# Patient Record
Sex: Male | Born: 1950
Health system: Southern US, Community
[De-identification: ages and names within clinical notes are randomized; demographics above are authoritative.]

## PROBLEM LIST (undated history)

## (undated) DIAGNOSIS — M549 Dorsalgia, unspecified: Secondary | ICD-10-CM

## (undated) DIAGNOSIS — R531 Weakness: Secondary | ICD-10-CM

## (undated) DIAGNOSIS — H409 Unspecified glaucoma: Secondary | ICD-10-CM

## (undated) DIAGNOSIS — Z8709 Personal history of other diseases of the respiratory system: Secondary | ICD-10-CM

## (undated) DIAGNOSIS — I10 Essential (primary) hypertension: Secondary | ICD-10-CM

## (undated) DIAGNOSIS — G8929 Other chronic pain: Secondary | ICD-10-CM

## (undated) DIAGNOSIS — C801 Malignant (primary) neoplasm, unspecified: Secondary | ICD-10-CM

## (undated) DIAGNOSIS — J189 Pneumonia, unspecified organism: Secondary | ICD-10-CM

## (undated) DIAGNOSIS — M542 Cervicalgia: Secondary | ICD-10-CM

## (undated) DIAGNOSIS — J45909 Unspecified asthma, uncomplicated: Secondary | ICD-10-CM

## (undated) HISTORY — PX: ADENOIDECTOMY: SUR15

## (undated) HISTORY — PX: OTHER SURGICAL HISTORY: SHX169

## (undated) HISTORY — PX: COLONOSCOPY: SHX174

---

## 1988-11-26 HISTORY — PX: BACK SURGERY: SHX140

## 1989-11-26 HISTORY — PX: HERNIA REPAIR: SHX51

## 1998-04-21 ENCOUNTER — Ambulatory Visit (HOSPITAL_BASED_OUTPATIENT_CLINIC_OR_DEPARTMENT_OTHER): Admission: RE | Admit: 1998-04-21 | Discharge: 1998-04-21 | Payer: Self-pay | Admitting: *Deleted

## 2004-12-13 ENCOUNTER — Inpatient Hospital Stay (HOSPITAL_COMMUNITY): Admission: EM | Admit: 2004-12-13 | Discharge: 2004-12-15 | Payer: Self-pay | Admitting: *Deleted

## 2008-02-19 ENCOUNTER — Observation Stay (HOSPITAL_COMMUNITY): Admission: RE | Admit: 2008-02-19 | Discharge: 2008-02-20 | Payer: Self-pay | Admitting: Urology

## 2008-02-19 ENCOUNTER — Encounter (INDEPENDENT_AMBULATORY_CARE_PROVIDER_SITE_OTHER): Payer: Self-pay | Admitting: Urology

## 2011-04-10 NOTE — Discharge Summary (Signed)
[name], [name]             ACCOUNT NO.:  [id number]   MEDICAL RECORD NO.:  [id number]          PATIENT TYPE:  INP   LOCATION:  1443                         FACILITY:  [facility]   PHYSICIAN:  Heloise Purpura, MD      DATE OF BIRTH:  1951-11-17   DATE OF ADMISSION:  02/19/2008  DATE OF DISCHARGE:  02/20/2008                               DISCHARGE SUMMARY   ADMISSION DIAGNOSIS:  Prostate cancer.   DISCHARGE DIAGNOSIS:  Prostate cancer.   HISTORY AND PHYSICAL:  For full details, please see admission history  and physical.  Briefly, [name] is a gentleman with clinically  localized adenocarcinoma of the prostate.  He underwent a discussion  regarding management options for treatment and elected to proceed with  surgical therapy and a robotic prostatectomy.   HOSPITAL COURSE:  On February 19, 2008, the patient was taken to the  operating room and underwent a robotic-assisted laparoscopic radical  prostatectomy.  He tolerated this procedure well without complications.  Postoperatively, he was able to be transferred to a regular hospital  room following recovery from anesthesia.  He was able to begin  ambulating the night of surgery and remained hemodynamically stable that  evening.  On the morning of postoperative day #1, his hematocrit  remained stable at 33.6.  He was advanced to a clear liquid diet, which  he tolerated without difficulty, and was transitioned to oral pain  medication.  He maintained excellent urine output with minimal output  from his pelvic drain.  His pelvic drain was therefore removed.  He had  met all discharge criteria and was able to be discharged home on the  afternoon of postoperative day #1.   DISPOSITION:  Home.   DISCHARGE MEDICATIONS:  He was instructed to resume his regular home  medications excepting any aspirin, nonsteroidal anti-inflammatory drugs,  or herbal supplements.  He was given a prescription to take Vicodin as  needed for pain, Colace  as a stool softener, and to begin Cipro 1 day  prior to his return visit for Foley catheter removal.   DISCHARGE INSTRUCTIONS:  He was instructed to be ambulatory but  specifically told to refrain from any heavy lifting, strenuous activity,  or driving.  He was given instructions on routine Foley catheter care  and instructed to gradually advance his diet once passing flatus.   FOLLOW UP:  He will follow up in 1 week for removal of his Foley  catheter and to discuss his surgical pathology in detail.      Heloise Purpura, MD  Electronically Signed     LB/MEDQ  D:  02/20/2008  T:  02/21/2008  Job:  [id number]

## 2011-04-10 NOTE — Op Note (Signed)
[name], [name]             ACCOUNT NO.:  [id number]   MEDICAL RECORD NO.:  [id number]          PATIENT TYPE:  INP   LOCATION:  0004                         FACILITY:  [facility]   PHYSICIAN:  [name] Purpura, MD      DATE OF BIRTH:  Oct 12, 1951   DATE OF PROCEDURE:  02/19/2008  DATE OF DISCHARGE:                               OPERATIVE REPORT   PREOPERATIVE DIAGNOSIS:  Clinically localized adenocarcinoma of the  prostate (clinical stage T1C versus T2 NX NX).   POSTOPERATIVE DIAGNOSIS:  Clinically localized adenocarcinoma of the  prostate (clinical stage T1C versus T2 NX NX).   PROCEDURE:  Robotic assisted laparoscopic radical prostatectomy  (bilateral nerve sparing).   SURGEON:  Dr. Heloise [name].   ASSISTANT:  Dr. Bjorn [name].   ANESTHESIA:  General.   COMPLICATIONS:  None.   ESTIMATED BLOOD LOSS:  100 mL.   SPECIMEN:  Prostate and seminal vesicles.   DISPOSITION OF SPECIMENS:  To pathology.   DRAINS:  1. #19 Blake pelvic drain.  2. 20-French coude' catheter.   INDICATION:  [name] is a 60 year old gentleman with recently  diagnosed clinically localized adenocarcinoma of the prostate.  After a  discussion regarding management options for treatment, he elected to  proceed with surgical therapy and the above procedure.  The potential  risks, complications, and alternative options were discussed in detail  with the patient and informed consent was obtained.   DESCRIPTION OF PROCEDURE:  The patient was taken to the operating room  and a general anesthetic was administered.  He was given preoperative  antibiotics, placed in the dorsal lithotomy position, and prepped and  draped in the usual sterile fashion.  Next a preoperative time-out was  performed.  Next a Foley catheter was inserted into the bladder and a  site was selected just to left of the umbilicus for placement of the  camera port.  This was placed using a standard open Hassan technique.  This allowed  entry into the peritoneal cavity under direct vision and  without difficulty.  A 12-mm port was placed and a pneumoperitoneum  established.  The 0 degree lens was used to inspect the abdomen and  there was no evidence for any intra-abdominal injuries or other  abnormalities.  The remaining ports were then placed.  Bilateral 8-mm  ports were placed 10 cm lateral to and just inferior to the camera port  site.  An additional 8-mm port was placed in the far left lateral  abdominal wall.  A 5-mm port was placed between the camera port and the  right robotic port.  An additional 12-mm port was placed in the far  right lateral abdominal wall for laparoscopic assistance.  All ports  were placed under direct vision and without difficulty.  The surgical  cart was then docked.  With the aid of the cautery scissors, the bladder  was reflected posteriorly allowing entry into the space of Retzius and  identification of the endopelvic fascia and prostate.  The endopelvic  fascia was incised from the apex back to the base of the prostate  bilaterally and the underlying  levator muscle fibers were swept  laterally off the prostate.  This helped to isolate the dorsal venous  complex which was then stapled and divided with a 45 mm flex ETS  stapler.  The bladder neck was then identified with the aid of Foley  catheter manipulation, it was entered anteriorly.  The catheter was  exposed and the catheter balloon was deflated.  The catheter was brought  into the operative field and used to retract the prostate anteriorly.  This exposed the posterior bladder neck which was then divided and  dissection continued between the bladder and prostate until the vasa  deferentia and seminal vesicles were identified.  The vasa deferentia  were isolated, divided and lifted anteriorly.  The seminal vesicles were  then dissected down to their tips and care was taken to control seminal  vesicle arterial blood supply.  The  seminal vesicles were then lifted  anteriorly and the space between Denonvilliers fascia and the anterior  rectum was bluntly developed thereby isolating the vascular pedicles of  the prostate.  The lateral prostatic fascia was then incised bilaterally  allowing the neurovascular bundles to be released and the vascular  pedicles of the prostate were then ligated with Hem-o-lok clips above  the level of the neurovascular bundles.  On the right side, a standard  nerve sparing procedure was performed.  On the left side, a partial  nerve sparing was performed and some tissue was left on the prostate  based on the patient's disease.  The neurovascular bundles were then  swept off the apex of the prostate and urethra.  The urethra was then  sharply divided allowing the prostate specimen to be disarticulated.  The pelvis was then copiously irrigated and hemostasis was ensured.  There was noted to be an area that was bleeding slightly along the right  neurovascular bundle and was controlled with a Hem-o-Lok clip placed  superficially parallel to the neurovascular bundle.  Attention then  turned to the urethral anastomosis.  A 2-0 Vicryl suture was placed  between Denonvilliers fascia, the posterior bladder and posterior  urethra to reapproximate these structures.  A double-armed 3-0 Monocryl  suture was then used to perform a 360 degree running tension-free  anastomosis between the bladder neck and urethra.  A new 20-French  coude' catheter was then inserted into the bladder and irrigated.  There  were no blood clots within the bladder and the anastomosis appeared to  be watertight.  A #19 Blake drain was then brought through the left  robotic port and appropriately positioned in the pelvis. It was secured  to skin with a nylon suture.  The surgical cart was then undocked. The  right lateral port site was closed with a zero Vicryl suture placed with  the suture passer device.  All remaining ports  were removed under direct  vision and the prostate specimen was removed intact within the Endopouch  retrieval bag via the periumbilical port site.  This fascial opening was  closed with a running zero Vicryl suture.  All port sites were injected  with a quarter percent Marcaine and reapproximated to the skin with  staples.  Sterile dressings were applied.  The patient appeared to  tolerate the procedure well and without complications.  He was able to  be extubated and transferred to recovery unit in satisfactory condition.      [name] Purpura, MD  Electronically Signed     LB/MEDQ  D:  02/19/2008  T:  02/20/2008  Job:  [id number]

## 2011-04-13 NOTE — Consult Note (Signed)
[name], [name]             ACCOUNT NO.:  [id number]   MEDICAL RECORD NO.:  [id number]          PATIENT TYPE:  INP   LOCATION:  4709                         FACILITY:  [facility]   PHYSICIAN:  Lyn Records III, M.D.DATE OF BIRTH:  [birth year]   DATE OF CONSULTATION:  12/14/2004  DATE OF DISCHARGE:                                   CONSULTATION   REASON FOR CONSULTATION:  Possible cardiac event.   CONCLUSIONS:  1.  Sudden onset of left arm, hand, and leg numbness, with associated      nausea, vomiting, and dizziness.  No headache.  Rule out neurological      event.  Rule out other.  With equal pulses, doubt aortic dissection.  2.  Slight elevation in CK-MB, with normal relative index, of uncertain      significance.  Normal troponins.  3.  Systemic arterial hypertension, on therapy.  4.  History of lumbar disc disease.   RECOMMENDATIONS:  1.  Stress Cardiolite.  2.  Consider neurological evaluation.  3.  Repeat EKG and cardiac markers today.  4.  A 2D Doppler echocardiogram to rule out aortic dissection.   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  Yesterday while having his car repaired, he began feeling  sick.  He states that some dizziness started, and then relatively suddenly  he began having numbness and tingling throughout the left side of his body  but not in his face.  There was dizziness that he describes as a sensation  as though he might pass out, but no true vertigo.  There was some nausea and  eventual vomiting.  There was no specific chest discomfort.  There was no  sensation of tearing quality discomfort.  This morning, he feels better.  He  is having persisting numbness in his left arm.  He had no significant  headache or double vision or other complaint with this.   SIGNIFICANT MEDICAL PROBLEMS:  Hypertension.   MEDICATIONS:  1.  Prinivil 10 mg daily.  2.  Aspirin 1 daily.   FAMILY HISTORY:  The patient's  father had a CVA but also had renal failure.  The patient's mother has no history of vascular disease.   HABITS:  Does not smoke.  Drinks alcohol socially.   SOCIAL HISTORY:  He is married.  Has two grown sons who are in good health.  His wife is living and in good health.   EXAMINATION:  GENERAL:  The patient is in no acute distress.  BLOOD PRESSURE:  110/70.  HEART RATE:  70.  NECK:  Veins are not distended.  Carotids are 2+ and symmetric.  LUNGS:  Clear.  CARDIAC:  Normal.  No rub, click, or gallop is heard.  ABDOMEN:  Soft.  Bowel sounds are normal.  EXTREMITIES:  Reveal no edema. Femoral pulses and posterior tibial pulses  are symmetric.  Radial pulses are symmetric.   EKG is normal.  CK-MB is minimally elevated at 4.8, with upper limits being  4.  The relative index, however, is normal.  The  total CPK is elevated at  872.  This is on the second set of markers.  The troponins have been totally  negative.  Other laboratory data are completely normal.  There is a low  normal potassium.  Chest x-ray does not reveal any evidence of aortic root  enlargement, significant cardiomegaly, or any pulmonary abnormalities.   DISCUSSION:  The history is odd.  I doubt that this represents a significant  ischemic heart disease problem.  Given the patient's age and history of  hypertension, however, especially with minimal elevation in the CK-MB,  stress Cardiolite is a reasonable approach.  If the CKs remain elevated,  will consider doing a 2D Doppler echocardiogram to look at the patient's  aorta.  If numbness continues in his arm, should consider neurological  evaluation to rule out cervical disc disease or central nervous system event  accounting for the rather sudden onset of nausea, vomiting, and left-sided  numbness.       HWS/MEDQ  D:  12/14/2004  T:  12/14/2004  Job:  [id number]   cc:   Candyce Churn, M.D.  [address] [place]  [place] [address]  Fax: [phone]    Theressa Millard, M.D.  [address] [place]  [place] [address]  Fax: [phone]
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
## 2011-08-20 LAB — ABO/RH: ABO/RH(D): AB POS

## 2011-08-20 LAB — TYPE AND SCREEN
ABO/RH(D): AB POS
Antibody Screen: NEGATIVE

## 2011-08-20 LAB — BASIC METABOLIC PANEL
CO2: 26
Calcium: 9
GFR calc Af Amer: >60
Sodium: 137

## 2011-08-20 LAB — CBC
Hemoglobin: 14.5
MCHC: 33.8
RBC: 5.04
WBC: 9.4

## 2011-08-20 LAB — HEMOGLOBIN AND HEMATOCRIT, BLOOD
HCT: 37.9 — ABNORMAL LOW
Hemoglobin: 12.6 — ABNORMAL LOW

## 2013-12-07 DIAGNOSIS — N529 Male erectile dysfunction, unspecified: Secondary | ICD-10-CM | POA: Diagnosis not present

## 2013-12-07 DIAGNOSIS — I10 Essential (primary) hypertension: Secondary | ICD-10-CM | POA: Diagnosis not present

## 2013-12-07 DIAGNOSIS — Z79899 Other long term (current) drug therapy: Secondary | ICD-10-CM | POA: Diagnosis not present

## 2013-12-07 DIAGNOSIS — E782 Mixed hyperlipidemia: Secondary | ICD-10-CM | POA: Diagnosis not present

## 2013-12-07 DIAGNOSIS — Z23 Encounter for immunization: Secondary | ICD-10-CM | POA: Diagnosis not present

## 2013-12-07 DIAGNOSIS — E559 Vitamin D deficiency, unspecified: Secondary | ICD-10-CM | POA: Diagnosis not present

## 2013-12-07 DIAGNOSIS — L738 Other specified follicular disorders: Secondary | ICD-10-CM | POA: Diagnosis not present

## 2013-12-07 DIAGNOSIS — Z0000 Encounter for general adult medical examination without abnormal findings: Secondary | ICD-10-CM | POA: Diagnosis not present

## 2013-12-07 DIAGNOSIS — E669 Obesity, unspecified: Secondary | ICD-10-CM | POA: Diagnosis not present

## 2014-01-11 DIAGNOSIS — H4011X0 Primary open-angle glaucoma, stage unspecified: Secondary | ICD-10-CM | POA: Diagnosis not present

## 2014-02-02 DIAGNOSIS — E782 Mixed hyperlipidemia: Secondary | ICD-10-CM | POA: Diagnosis not present

## 2014-02-02 DIAGNOSIS — R748 Abnormal levels of other serum enzymes: Secondary | ICD-10-CM | POA: Diagnosis not present

## 2014-04-02 DIAGNOSIS — C61 Malignant neoplasm of prostate: Secondary | ICD-10-CM | POA: Diagnosis not present

## 2014-04-02 DIAGNOSIS — N529 Male erectile dysfunction, unspecified: Secondary | ICD-10-CM | POA: Diagnosis not present

## 2014-04-06 DIAGNOSIS — S139XXA Sprain of joints and ligaments of unspecified parts of neck, initial encounter: Secondary | ICD-10-CM | POA: Diagnosis not present

## 2014-04-06 DIAGNOSIS — S40019A Contusion of unspecified shoulder, initial encounter: Secondary | ICD-10-CM | POA: Diagnosis not present

## 2014-04-12 DIAGNOSIS — IMO0002 Reserved for concepts with insufficient information to code with codable children: Secondary | ICD-10-CM | POA: Diagnosis not present

## 2014-04-12 DIAGNOSIS — M5412 Radiculopathy, cervical region: Secondary | ICD-10-CM | POA: Diagnosis not present

## 2014-04-12 DIAGNOSIS — M545 Low back pain, unspecified: Secondary | ICD-10-CM | POA: Diagnosis not present

## 2014-04-12 DIAGNOSIS — M542 Cervicalgia: Secondary | ICD-10-CM | POA: Diagnosis not present

## 2014-04-15 DIAGNOSIS — H4011X0 Primary open-angle glaucoma, stage unspecified: Secondary | ICD-10-CM | POA: Diagnosis not present

## 2014-04-15 DIAGNOSIS — H2510 Age-related nuclear cataract, unspecified eye: Secondary | ICD-10-CM | POA: Diagnosis not present

## 2014-04-23 DIAGNOSIS — M545 Low back pain, unspecified: Secondary | ICD-10-CM | POA: Diagnosis not present

## 2014-04-23 DIAGNOSIS — M542 Cervicalgia: Secondary | ICD-10-CM | POA: Diagnosis not present

## 2014-04-26 DIAGNOSIS — M545 Low back pain, unspecified: Secondary | ICD-10-CM | POA: Diagnosis not present

## 2014-04-26 DIAGNOSIS — M542 Cervicalgia: Secondary | ICD-10-CM | POA: Diagnosis not present

## 2014-05-03 DIAGNOSIS — M542 Cervicalgia: Secondary | ICD-10-CM | POA: Diagnosis not present

## 2014-05-03 DIAGNOSIS — M545 Low back pain, unspecified: Secondary | ICD-10-CM | POA: Diagnosis not present

## 2014-05-05 DIAGNOSIS — M542 Cervicalgia: Secondary | ICD-10-CM | POA: Diagnosis not present

## 2014-05-05 DIAGNOSIS — M545 Low back pain, unspecified: Secondary | ICD-10-CM | POA: Diagnosis not present

## 2014-05-10 DIAGNOSIS — M542 Cervicalgia: Secondary | ICD-10-CM | POA: Diagnosis not present

## 2014-05-10 DIAGNOSIS — M545 Low back pain, unspecified: Secondary | ICD-10-CM | POA: Diagnosis not present

## 2014-05-12 DIAGNOSIS — M545 Low back pain, unspecified: Secondary | ICD-10-CM | POA: Diagnosis not present

## 2014-05-12 DIAGNOSIS — M542 Cervicalgia: Secondary | ICD-10-CM | POA: Diagnosis not present

## 2014-05-17 DIAGNOSIS — M545 Low back pain, unspecified: Secondary | ICD-10-CM | POA: Diagnosis not present

## 2014-05-17 DIAGNOSIS — M542 Cervicalgia: Secondary | ICD-10-CM | POA: Diagnosis not present

## 2014-05-19 DIAGNOSIS — M545 Low back pain, unspecified: Secondary | ICD-10-CM | POA: Diagnosis not present

## 2014-05-19 DIAGNOSIS — M542 Cervicalgia: Secondary | ICD-10-CM | POA: Diagnosis not present

## 2014-05-26 DIAGNOSIS — M47817 Spondylosis without myelopathy or radiculopathy, lumbosacral region: Secondary | ICD-10-CM | POA: Diagnosis not present

## 2014-05-26 DIAGNOSIS — M47812 Spondylosis without myelopathy or radiculopathy, cervical region: Secondary | ICD-10-CM | POA: Diagnosis not present

## 2014-06-02 DIAGNOSIS — M545 Low back pain, unspecified: Secondary | ICD-10-CM | POA: Diagnosis not present

## 2014-06-07 DIAGNOSIS — M542 Cervicalgia: Secondary | ICD-10-CM | POA: Diagnosis not present

## 2014-06-07 DIAGNOSIS — IMO0002 Reserved for concepts with insufficient information to code with codable children: Secondary | ICD-10-CM | POA: Diagnosis not present

## 2014-06-07 DIAGNOSIS — M5412 Radiculopathy, cervical region: Secondary | ICD-10-CM | POA: Diagnosis not present

## 2014-07-13 DIAGNOSIS — IMO0002 Reserved for concepts with insufficient information to code with codable children: Secondary | ICD-10-CM | POA: Diagnosis not present

## 2014-07-16 DIAGNOSIS — H4011X0 Primary open-angle glaucoma, stage unspecified: Secondary | ICD-10-CM | POA: Diagnosis not present

## 2014-08-24 DIAGNOSIS — IMO0002 Reserved for concepts with insufficient information to code with codable children: Secondary | ICD-10-CM | POA: Diagnosis not present

## 2014-09-08 DIAGNOSIS — M5416 Radiculopathy, lumbar region: Secondary | ICD-10-CM | POA: Diagnosis not present

## 2014-09-27 DIAGNOSIS — M5416 Radiculopathy, lumbar region: Secondary | ICD-10-CM | POA: Diagnosis not present

## 2014-10-05 DIAGNOSIS — M5416 Radiculopathy, lumbar region: Secondary | ICD-10-CM | POA: Diagnosis not present

## 2014-10-13 DIAGNOSIS — H4011X1 Primary open-angle glaucoma, mild stage: Secondary | ICD-10-CM | POA: Diagnosis not present

## 2014-12-01 ENCOUNTER — Other Ambulatory Visit: Payer: Self-pay | Admitting: Orthopedic Surgery

## 2014-12-09 DIAGNOSIS — Z79899 Other long term (current) drug therapy: Secondary | ICD-10-CM | POA: Diagnosis not present

## 2014-12-09 DIAGNOSIS — M5412 Radiculopathy, cervical region: Secondary | ICD-10-CM | POA: Diagnosis not present

## 2014-12-09 DIAGNOSIS — M5416 Radiculopathy, lumbar region: Secondary | ICD-10-CM | POA: Diagnosis not present

## 2014-12-09 DIAGNOSIS — N529 Male erectile dysfunction, unspecified: Secondary | ICD-10-CM | POA: Diagnosis not present

## 2014-12-09 DIAGNOSIS — Z0001 Encounter for general adult medical examination with abnormal findings: Secondary | ICD-10-CM | POA: Diagnosis not present

## 2014-12-09 DIAGNOSIS — E780 Pure hypercholesterolemia: Secondary | ICD-10-CM | POA: Diagnosis not present

## 2014-12-09 DIAGNOSIS — C61 Malignant neoplasm of prostate: Secondary | ICD-10-CM | POA: Diagnosis not present

## 2014-12-09 DIAGNOSIS — M25569 Pain in unspecified knee: Secondary | ICD-10-CM | POA: Diagnosis not present

## 2014-12-09 DIAGNOSIS — I10 Essential (primary) hypertension: Secondary | ICD-10-CM | POA: Diagnosis not present

## 2014-12-09 DIAGNOSIS — E559 Vitamin D deficiency, unspecified: Secondary | ICD-10-CM | POA: Diagnosis not present

## 2014-12-09 NOTE — Pre-Procedure Instructions (Signed)
[name]  12/09/2014   Your procedure is scheduled on:  Wed, Jan 20 @ 8:30 AM  Report to [facility]  at 6:30 AM.  Call this number if you have problems the morning of surgery: [phone]   Remember:   Do not eat food or drink liquids after midnight.   Take these medicines the morning of surgery with A SIP OF WATER:               No Goody's,BC's,Aleve,Aspirin,Ibuprofen,Fish Oil,or any Herbal Medications   Do not wear jewelry  Do not wear lotions, powders, or colognes. You may wear deodorant.  Men may shave face and neck.  Do not bring valuables to the hospital.  [facility] is not responsible                  for any belongings or valuables.               Contacts, dentures or bridgework may not be worn into surgery.  Leave suitcase in the car. After surgery it may be brought to your room.  For patients admitted to the hospital, discharge time is determined by your                treatment team.                Special Instructions:  [place] - Preparing for Surgery  Before surgery, you can play an important role.  Because skin is not sterile, your skin needs to be as free of germs as possible.  You can reduce the number of germs on you skin by washing with CHG (chlorahexidine gluconate) soap before surgery.  CHG is an antiseptic cleaner which kills germs and bonds with the skin to continue killing germs even after washing.  Please DO NOT use if you have an allergy to CHG or antibacterial soaps.  If your skin becomes reddened/irritated stop using the CHG and inform your nurse when you arrive at Short Stay.  Do not shave (including legs and underarms) for at least 48 hours prior to the first CHG shower.  You may shave your face.  Please follow these instructions carefully:   1.  Shower with CHG Soap the night before surgery and the                                morning of Surgery.  2.  If you choose to wash your hair, wash your hair first as usual with your        normal shampoo.  3.  After you shampoo, rinse your hair and body thoroughly to remove the                      Shampoo.  4.  Use CHG as you would any other liquid soap.  You can apply chg directly       to the skin and wash gently with scrungie or a clean washcloth.  5.  Apply the CHG Soap to your body ONLY FROM THE NECK DOWN.        Do not use on open wounds or open sores.  Avoid contact with your eyes,       ears, mouth and genitals (private parts).  Wash genitals (private parts)       with your normal soap.  6.  Wash thoroughly, paying special attention to the  area where your surgery        will be performed.  7.  Thoroughly rinse your body with warm water from the neck down.  8.  DO NOT shower/wash with your normal soap after using and rinsing off       the CHG Soap.  9.  Pat yourself dry with a clean towel.            10.  Wear clean pajamas.            11.  Place clean sheets on your bed the night of your first shower and do not        sleep with pets.  Day of Surgery  Do not apply any lotions/deoderants the morning of surgery.  Please wear clean clothes to the hospital/surgery center.     Please read over the following fact sheets that you were given: Pain Booklet, Coughing and Deep Breathing, Blood Transfusion Information, MRSA Information and Surgical Site Infection Prevention

## 2014-12-10 ENCOUNTER — Encounter (HOSPITAL_COMMUNITY): Payer: Self-pay

## 2014-12-10 ENCOUNTER — Encounter (HOSPITAL_COMMUNITY)
Admission: RE | Admit: 2014-12-10 | Discharge: 2014-12-10 | Disposition: A | Discharge status: Home / Self Care | Payer: Medicare Other | Source: Ambulatory Visit | Attending: Orthopedic Surgery | Admitting: Orthopedic Surgery

## 2014-12-10 ENCOUNTER — Ambulatory Visit (HOSPITAL_COMMUNITY)
Admission: RE | Admit: 2014-12-10 | Discharge: 2014-12-10 | Disposition: A | Discharge status: Home / Self Care | Payer: Medicare Other | Source: Ambulatory Visit | Attending: Orthopedic Surgery | Admitting: Orthopedic Surgery

## 2014-12-10 DIAGNOSIS — Z87891 Personal history of nicotine dependence: Secondary | ICD-10-CM | POA: Diagnosis not present

## 2014-12-10 DIAGNOSIS — Z01818 Encounter for other preprocedural examination: Secondary | ICD-10-CM

## 2014-12-10 DIAGNOSIS — G8929 Other chronic pain: Secondary | ICD-10-CM | POA: Diagnosis not present

## 2014-12-10 DIAGNOSIS — J45909 Unspecified asthma, uncomplicated: Secondary | ICD-10-CM | POA: Diagnosis not present

## 2014-12-10 DIAGNOSIS — G4733 Obstructive sleep apnea (adult) (pediatric): Secondary | ICD-10-CM | POA: Diagnosis not present

## 2014-12-10 DIAGNOSIS — G9520 Unspecified cord compression: Secondary | ICD-10-CM | POA: Diagnosis not present

## 2014-12-10 DIAGNOSIS — M549 Dorsalgia, unspecified: Secondary | ICD-10-CM | POA: Diagnosis not present

## 2014-12-10 DIAGNOSIS — I10 Essential (primary) hypertension: Secondary | ICD-10-CM | POA: Insufficient documentation

## 2014-12-10 HISTORY — DX: Personal history of other diseases of the respiratory system: Z87.09

## 2014-12-10 HISTORY — DX: Other chronic pain: G89.29

## 2014-12-10 HISTORY — DX: Unspecified glaucoma: H40.9

## 2014-12-10 HISTORY — DX: Weakness: R53.1

## 2014-12-10 HISTORY — DX: Dorsalgia, unspecified: M54.9

## 2014-12-10 HISTORY — DX: Unspecified asthma, uncomplicated: J45.909

## 2014-12-10 HISTORY — DX: Cervicalgia: M54.2

## 2014-12-10 HISTORY — DX: Essential (primary) hypertension: I10

## 2014-12-10 HISTORY — DX: Malignant (primary) neoplasm, unspecified: C80.1

## 2014-12-10 HISTORY — DX: Pneumonia, unspecified organism: J18.9

## 2014-12-10 LAB — URINALYSIS, ROUTINE W REFLEX MICROSCOPIC
Bilirubin Urine: NEGATIVE
Glucose, UA: NEGATIVE mg/dL
KETONES UR: NEGATIVE mg/dL
Leukocytes, UA: NEGATIVE
Nitrite: NEGATIVE
PROTEIN: 30 mg/dL — AB
Specific Gravity, Urine: 1.023 (ref 1.005–1.030)
Urobilinogen, UA: 0.2 mg/dL (ref 0.0–1.0)
pH: 5 (ref 5.0–8.0)

## 2014-12-10 LAB — APTT: aPTT: 29 seconds (ref 24–37)

## 2014-12-10 LAB — CBC WITH DIFFERENTIAL/PLATELET
Basophils Absolute: 0 10*3/uL (ref 0.0–0.1)
Basophils Relative: 0 % (ref 0–1)
Eosinophils Absolute: 0.3 10*3/uL (ref 0.0–0.7)
Eosinophils Relative: 4 % (ref 0–5)
HEMATOCRIT: 46.7 % (ref 39.0–52.0)
Hemoglobin: 15.9 g/dL (ref 13.0–17.0)
LYMPHS ABS: 1.9 10*3/uL (ref 0.7–4.0)
LYMPHS PCT: 25 % (ref 12–46)
MCH: 29.2 pg (ref 26.0–34.0)
MCHC: 34 g/dL (ref 30.0–36.0)
MCV: 85.8 fL (ref 78.0–100.0)
MONOS PCT: 8 % (ref 3–12)
Monocytes Absolute: 0.6 10*3/uL (ref 0.1–1.0)
NEUTROS ABS: 4.8 10*3/uL (ref 1.7–7.7)
Neutrophils Relative %: 63 % (ref 43–77)
PLATELETS: 281 10*3/uL (ref 150–400)
RBC: 5.44 MIL/uL (ref 4.22–5.81)
RDW: 13.3 % (ref 11.5–15.5)
WBC: 7.6 10*3/uL (ref 4.0–10.5)

## 2014-12-10 LAB — COMPREHENSIVE METABOLIC PANEL
ALBUMIN: 4.2 g/dL (ref 3.5–5.2)
ALT: 23 U/L (ref 0–53)
AST: 26 U/L (ref 0–37)
Alkaline Phosphatase: 58 U/L (ref 39–117)
Anion gap: 12 (ref 5–15)
BUN: 15 mg/dL (ref 6–23)
CO2: 25 mmol/L (ref 19–32)
CREATININE: 1.08 mg/dL (ref 0.50–1.35)
Calcium: 9.7 mg/dL (ref 8.4–10.5)
Chloride: 103 mEq/L (ref 96–112)
GFR calc Af Amer: 81 mL/min — ABNORMAL LOW (ref >90)
GFR, EST NON AFRICAN AMERICAN: 70 mL/min — AB (ref >90)
Glucose, Bld: 98 mg/dL (ref 70–99)
POTASSIUM: 4.4 mmol/L (ref 3.5–5.1)
Sodium: 140 mmol/L (ref 135–145)
TOTAL PROTEIN: 7.5 g/dL (ref 6.0–8.3)
Total Bilirubin: 0.5 mg/dL (ref 0.3–1.2)

## 2014-12-10 LAB — TYPE AND SCREEN
ABO/RH(D): AB POS
Antibody Screen: NEGATIVE

## 2014-12-10 LAB — SURGICAL PCR SCREEN
MRSA, PCR: NEGATIVE
STAPHYLOCOCCUS AUREUS: NEGATIVE

## 2014-12-10 LAB — URINE MICROSCOPIC-ADD ON

## 2014-12-10 LAB — PROTIME-INR
INR: 0.96 (ref 0.00–1.49)
Prothrombin Time: 12.9 seconds (ref 11.6–15.2)

## 2014-12-10 LAB — ABO/RH: ABO/RH(D): AB POS

## 2014-12-10 MED ORDER — POVIDONE-IODINE 7.5 % EX SOLN: Freq: Once | CUTANEOUS | Status: DC

## 2014-12-10 NOTE — Progress Notes (Signed)
   12/10/14 1049  OBSTRUCTIVE SLEEP APNEA  Have you ever been diagnosed with sleep apnea through a sleep study? No  Do you snore loudly (loud enough to be heard through closed doors)?  1  Do you often feel tired, fatigued, or sleepy during the daytime? 0  Has anyone observed you stop breathing during your sleep? 1  Do you have, or are you being treated for high blood pressure? 1  BMI more than 35 kg/m2? 0  Age over 66 years old? 1  Neck circumference greater than 40 cm/16 inches? 1 (17)  Gender: 1  Obstructive Sleep Apnea Score 6  Score 4 or greater  Results sent to PCP

## 2014-12-10 NOTE — Progress Notes (Signed)
Pt doesn't have a cardiologist  Stress test in epic from 2006-found it was a pinched nerve in neck  Denies ever having an echo or heart cath  Dr.Gates is MEdical Md   Denies EKG or CXR in past yr

## 2014-12-10 NOTE — Progress Notes (Signed)
Anesthesia Chart Review:  Patient is a 64 year old male scheduled for L4-5, L5-S1 decompression, left sided L5-S1 fusion on 12/15/14 by Dr. Lynann Bologna.  History includes former smoker, HTN, asthma, chronic back pain, glaucoma, prostate cancer s/p prostatectomy '09. OSA screening score was 6.  PCP is Dr. Inda Merlin.   Meds include Lumigan, Combigan, lisinopril.  12/10/14 EKG: SB at 57 bpm, low voltage QRS, cannot rule out anterior infarct (age undetermined).  Poor anterior r wave progression when compared to his second EKG on 12/13/04, but more similar to his first EKG on 12/13/04 ([facility]).  No CV symptoms documented at his PAT visit.   Last stress test noted in Epic was in 2006.   Preoperative CXR and labs noted.   Further evaluation by his assigned anesthesiologist on the day of surgery, but if no acute changes then it is anticipated that he could proceed as planned.    [name] [facility] Short Stay Center/Anesthesiology Phone [phone] 12/10/2014 5:07 PM

## 2014-12-14 MED ORDER — CEFAZOLIN SODIUM-DEXTROSE 2-3 GM-% IV SOLR
2.0000 g | INTRAVENOUS | Status: AC
  Administered 2014-12-15 (×2): 2 g via INTRAVENOUS
  Filled 2014-12-14: qty 50

## 2014-12-15 ENCOUNTER — Encounter (HOSPITAL_COMMUNITY)
Admission: RE | Disposition: A | Discharge status: Home / Self Care | Payer: Medicare Other | Source: Ambulatory Visit | Attending: Orthopedic Surgery

## 2014-12-15 ENCOUNTER — Inpatient Hospital Stay (HOSPITAL_COMMUNITY): Payer: Medicare Other | Admitting: Vascular Surgery

## 2014-12-15 ENCOUNTER — Inpatient Hospital Stay (HOSPITAL_COMMUNITY): Payer: Medicare Other

## 2014-12-15 ENCOUNTER — Encounter (HOSPITAL_COMMUNITY): Payer: Self-pay | Admitting: Anesthesiology

## 2014-12-15 ENCOUNTER — Inpatient Hospital Stay (HOSPITAL_COMMUNITY): Payer: Medicare Other | Admitting: Anesthesiology

## 2014-12-15 ENCOUNTER — Inpatient Hospital Stay (HOSPITAL_COMMUNITY)
Admission: RE | Admit: 2014-12-15 | Discharge: 2014-12-18 | DRG: 460 | Disposition: A | Discharge status: Home / Self Care | Payer: Medicare Other | Source: Ambulatory Visit | Attending: Orthopedic Surgery | Admitting: Orthopedic Surgery

## 2014-12-15 DIAGNOSIS — M4807 Spinal stenosis, lumbosacral region: Secondary | ICD-10-CM | POA: Diagnosis present

## 2014-12-15 DIAGNOSIS — M62830 Muscle spasm of back: Secondary | ICD-10-CM | POA: Diagnosis present

## 2014-12-15 DIAGNOSIS — M5410 Radiculopathy, site unspecified: Secondary | ICD-10-CM | POA: Diagnosis present

## 2014-12-15 DIAGNOSIS — M4806 Spinal stenosis, lumbar region: Secondary | ICD-10-CM | POA: Diagnosis not present

## 2014-12-15 DIAGNOSIS — H409 Unspecified glaucoma: Secondary | ICD-10-CM | POA: Diagnosis present

## 2014-12-15 DIAGNOSIS — Z87891 Personal history of nicotine dependence: Secondary | ICD-10-CM | POA: Diagnosis not present

## 2014-12-15 DIAGNOSIS — Z981 Arthrodesis status: Secondary | ICD-10-CM | POA: Diagnosis not present

## 2014-12-15 DIAGNOSIS — I10 Essential (primary) hypertension: Secondary | ICD-10-CM | POA: Diagnosis present

## 2014-12-15 DIAGNOSIS — M5417 Radiculopathy, lumbosacral region: Secondary | ICD-10-CM | POA: Diagnosis not present

## 2014-12-15 DIAGNOSIS — M5137 Other intervertebral disc degeneration, lumbosacral region: Secondary | ICD-10-CM | POA: Diagnosis not present

## 2014-12-15 DIAGNOSIS — R202 Paresthesia of skin: Secondary | ICD-10-CM | POA: Diagnosis not present

## 2014-12-15 DIAGNOSIS — M549 Dorsalgia, unspecified: Secondary | ICD-10-CM | POA: Diagnosis not present

## 2014-12-15 DIAGNOSIS — M5416 Radiculopathy, lumbar region: Secondary | ICD-10-CM | POA: Diagnosis not present

## 2014-12-15 DIAGNOSIS — M79605 Pain in left leg: Secondary | ICD-10-CM | POA: Diagnosis not present

## 2014-12-15 DIAGNOSIS — Z4889 Encounter for other specified surgical aftercare: Secondary | ICD-10-CM | POA: Diagnosis not present

## 2014-12-15 DIAGNOSIS — Z79899 Other long term (current) drug therapy: Secondary | ICD-10-CM

## 2014-12-15 DIAGNOSIS — Z419 Encounter for procedure for purposes other than remedying health state, unspecified: Secondary | ICD-10-CM

## 2014-12-15 DIAGNOSIS — M79606 Pain in leg, unspecified: Secondary | ICD-10-CM | POA: Diagnosis not present

## 2014-12-15 DIAGNOSIS — M542 Cervicalgia: Secondary | ICD-10-CM | POA: Diagnosis not present

## 2014-12-15 SURGERY — POSTERIOR LUMBAR FUSION 1 LEVEL
Anesthesia: General | Site: Back | Laterality: Left

## 2014-12-15 MED ORDER — METHYLENE BLUE 1 % INJ SOLN
INTRAMUSCULAR | Status: AC
  Filled 2014-12-15: qty 10

## 2014-12-15 MED ORDER — LIDOCAINE HCL (CARDIAC) 20 MG/ML IV SOLN
INTRAVENOUS | Status: DC | PRN
  Administered 2014-12-15: 100 mg via INTRAVENOUS

## 2014-12-15 MED ORDER — GLYCOPYRROLATE 0.2 MG/ML IJ SOLN
INTRAMUSCULAR | Status: DC | PRN
  Administered 2014-12-15 (×2): 0.2 mg via INTRAVENOUS
  Administered 2014-12-15: 0.4 mg via INTRAVENOUS

## 2014-12-15 MED ORDER — MIDAZOLAM HCL 5 MG/5ML IJ SOLN
INTRAMUSCULAR | Status: DC | PRN
  Administered 2014-12-15 (×2): 2 mg via INTRAVENOUS

## 2014-12-15 MED ORDER — FENTANYL CITRATE 0.05 MG/ML IJ SOLN
INTRAMUSCULAR | Status: AC
  Filled 2014-12-15: qty 5

## 2014-12-15 MED ORDER — METHYLENE BLUE 1 % INJ SOLN
INTRAMUSCULAR | Status: DC | PRN
  Administered 2014-12-15: .5 mL

## 2014-12-15 MED ORDER — PROPOFOL INFUSION 10 MG/ML OPTIME
INTRAVENOUS | Status: DC | PRN
  Administered 2014-12-15: 75 ug/kg/min via INTRAVENOUS

## 2014-12-15 MED ORDER — BRIMONIDINE TARTRATE 0.2 % OP SOLN
1.0000 [drp] | Freq: Two times a day (BID) | OPHTHALMIC | Status: DC
  Administered 2014-12-15 – 2014-12-18 (×5): 1 [drp] via OPHTHALMIC
  Filled 2014-12-15: qty 5

## 2014-12-15 MED ORDER — SODIUM CHLORIDE 0.9 % IJ SOLN
3.0000 mL | Freq: Two times a day (BID) | INTRAMUSCULAR | Status: DC
  Administered 2014-12-16 – 2014-12-18 (×5): 3 mL via INTRAVENOUS

## 2014-12-15 MED ORDER — ACETAMINOPHEN 325 MG PO TABS: 650.0000 mg | ORAL_TABLET | ORAL | Status: DC | PRN

## 2014-12-15 MED ORDER — SODIUM CHLORIDE 0.9 % IV SOLN
INTRAVENOUS | Status: DC
  Administered 2014-12-15: 18:00:00 via INTRAVENOUS

## 2014-12-15 MED ORDER — THROMBIN 20000 UNITS EX SOLR
CUTANEOUS | Status: DC | PRN
  Administered 2014-12-15: 20 mL via TOPICAL

## 2014-12-15 MED ORDER — PROPOFOL 10 MG/ML IV BOLUS
INTRAVENOUS | Status: DC | PRN
  Administered 2014-12-15: 150 mg via INTRAVENOUS
  Administered 2014-12-15: 30 mg via INTRAVENOUS

## 2014-12-15 MED ORDER — TIMOLOL MALEATE 0.5 % OP SOLN
1.0000 [drp] | Freq: Two times a day (BID) | OPHTHALMIC | Status: DC
  Administered 2014-12-16 – 2014-12-18 (×4): 1 [drp] via OPHTHALMIC
  Filled 2014-12-15 (×3): qty 5

## 2014-12-15 MED ORDER — PROPOFOL 10 MG/ML IV EMUL
INTRAVENOUS | Status: AC
  Filled 2014-12-15: qty 150

## 2014-12-15 MED ORDER — SODIUM CHLORIDE 0.9 % IV SOLN: 250.0000 mL | INTRAVENOUS | Status: DC

## 2014-12-15 MED ORDER — 0.9 % SODIUM CHLORIDE (POUR BTL) OPTIME
TOPICAL | Status: DC | PRN
  Administered 2014-12-15: 1000 mL
  Administered 2014-12-15: 900 mL
  Administered 2014-12-15: 1000 mL
  Administered 2014-12-15: 800 mL

## 2014-12-15 MED ORDER — EPHEDRINE SULFATE 50 MG/ML IJ SOLN
INTRAMUSCULAR | Status: AC
  Filled 2014-12-15: qty 1

## 2014-12-15 MED ORDER — ONDANSETRON HCL 4 MG/2ML IJ SOLN
4.0000 mg | Freq: Four times a day (QID) | INTRAMUSCULAR | Status: DC | PRN
  Administered 2014-12-15 – 2014-12-16 (×2): 4 mg via INTRAVENOUS
  Filled 2014-12-15 (×2): qty 2

## 2014-12-15 MED ORDER — ONDANSETRON HCL 4 MG/2ML IJ SOLN: 4.0000 mg | INTRAMUSCULAR | Status: DC | PRN

## 2014-12-15 MED ORDER — EPHEDRINE SULFATE 50 MG/ML IJ SOLN
INTRAMUSCULAR | Status: DC | PRN
  Administered 2014-12-15 (×5): 10 mg via INTRAVENOUS
  Administered 2014-12-15: 20 mg via INTRAVENOUS

## 2014-12-15 MED ORDER — ONDANSETRON HCL 4 MG/2ML IJ SOLN
INTRAMUSCULAR | Status: DC | PRN
  Administered 2014-12-15: 4 mg via INTRAVENOUS

## 2014-12-15 MED ORDER — LISINOPRIL 10 MG PO TABS
10.0000 mg | ORAL_TABLET | Freq: Every day | ORAL | Status: DC
  Administered 2014-12-15 – 2014-12-18 (×4): 10 mg via ORAL
  Filled 2014-12-15 (×5): qty 1

## 2014-12-15 MED ORDER — HYDROMORPHONE 0.3 MG/ML IV SOLN
INTRAVENOUS | Status: AC
  Filled 2014-12-15: qty 25

## 2014-12-15 MED ORDER — GLYCOPYRROLATE 0.2 MG/ML IJ SOLN
INTRAMUSCULAR | Status: AC
Start: 2014-12-15 — End: 2014-12-15
  Filled 2014-12-15: qty 1

## 2014-12-15 MED ORDER — SODIUM CHLORIDE 0.9 % IJ SOLN: 3.0000 mL | INTRAMUSCULAR | Status: DC | PRN

## 2014-12-15 MED ORDER — SUCCINYLCHOLINE CHLORIDE 20 MG/ML IJ SOLN
INTRAMUSCULAR | Status: AC
  Filled 2014-12-15: qty 1

## 2014-12-15 MED ORDER — DIAZEPAM 5 MG PO TABS
5.0000 mg | ORAL_TABLET | Freq: Four times a day (QID) | ORAL | Status: DC | PRN
Start: 2014-12-15 — End: 2014-12-18
  Administered 2014-12-15 – 2014-12-18 (×8): 5 mg via ORAL
  Filled 2014-12-15 (×7): qty 1

## 2014-12-15 MED ORDER — PHENOL 1.4 % MT LIQD: 1.0000 | OROMUCOSAL | Status: DC | PRN

## 2014-12-15 MED ORDER — SENNOSIDES-DOCUSATE SODIUM 8.6-50 MG PO TABS
1.0000 | ORAL_TABLET | Freq: Every evening | ORAL | Status: DC | PRN
Start: 2014-12-15 — End: 2014-12-18

## 2014-12-15 MED ORDER — PHENYLEPHRINE HCL 10 MG/ML IJ SOLN
INTRAMUSCULAR | Status: DC | PRN
  Administered 2014-12-15 (×8): 80 ug via INTRAVENOUS

## 2014-12-15 MED ORDER — BISACODYL 5 MG PO TBEC: 5.0000 mg | DELAYED_RELEASE_TABLET | Freq: Every day | ORAL | Status: DC | PRN

## 2014-12-15 MED ORDER — MORPHINE SULFATE (PF) 1 MG/ML IV SOLN
INTRAVENOUS | Status: DC
  Administered 2014-12-15: 22 mg via INTRAVENOUS
  Administered 2014-12-15 (×2): via INTRAVENOUS
  Administered 2014-12-16: 10 mg via INTRAVENOUS
  Administered 2014-12-16: 8 mg via INTRAVENOUS
  Filled 2014-12-15: qty 25

## 2014-12-15 MED ORDER — CEFAZOLIN SODIUM 1-5 GM-% IV SOLN
1.0000 g | Freq: Three times a day (TID) | INTRAVENOUS | Status: AC
  Administered 2014-12-15 – 2014-12-16 (×2): 1 g via INTRAVENOUS
  Filled 2014-12-15 (×2): qty 50

## 2014-12-15 MED ORDER — MIDAZOLAM HCL 2 MG/2ML IJ SOLN
INTRAMUSCULAR | Status: AC
  Filled 2014-12-15: qty 2

## 2014-12-15 MED ORDER — LATANOPROST 0.005 % OP SOLN
1.0000 [drp] | Freq: Every day | OPHTHALMIC | Status: DC
  Administered 2014-12-15 – 2014-12-17 (×3): 1 [drp] via OPHTHALMIC
  Filled 2014-12-15 (×2): qty 2.5

## 2014-12-15 MED ORDER — PHENYLEPHRINE 40 MCG/ML (10ML) SYRINGE FOR IV PUSH (FOR BLOOD PRESSURE SUPPORT)
PREFILLED_SYRINGE | INTRAVENOUS | Status: AC
  Filled 2014-12-15: qty 10

## 2014-12-15 MED ORDER — BUPIVACAINE-EPINEPHRINE 0.25% -1:200000 IJ SOLN
INTRAMUSCULAR | Status: DC | PRN
  Administered 2014-12-15: 30 mL

## 2014-12-15 MED ORDER — THROMBIN 20000 UNITS EX SOLR
CUTANEOUS | Status: DC | PRN
  Administered 2014-12-15: 20000 [IU] via TOPICAL

## 2014-12-15 MED ORDER — MENTHOL 3 MG MT LOZG: 1.0000 | LOZENGE | OROMUCOSAL | Status: DC | PRN

## 2014-12-15 MED ORDER — ACETAMINOPHEN 10 MG/ML IV SOLN
INTRAVENOUS | Status: AC
  Administered 2014-12-15: 1000 mg via INTRAVENOUS
  Filled 2014-12-15: qty 100

## 2014-12-15 MED ORDER — DIAZEPAM 5 MG PO TABS
ORAL_TABLET | ORAL | Status: AC
  Administered 2014-12-15: 5 mg via ORAL
  Filled 2014-12-15: qty 1

## 2014-12-15 MED ORDER — ACETAMINOPHEN 650 MG RE SUPP: 650.0000 mg | RECTAL | Status: DC | PRN

## 2014-12-15 MED ORDER — PROPOFOL 10 MG/ML IV EMUL
INTRAVENOUS | Status: AC
Start: 2014-12-15 — End: 2014-12-15
  Filled 2014-12-15: qty 50

## 2014-12-15 MED ORDER — OXYCODONE-ACETAMINOPHEN 5-325 MG PO TABS
ORAL_TABLET | ORAL | Status: AC
  Administered 2014-12-15: 2 via ORAL
  Filled 2014-12-15: qty 2

## 2014-12-15 MED ORDER — PROPOFOL 10 MG/ML IV BOLUS
INTRAVENOUS | Status: AC
  Filled 2014-12-15: qty 20

## 2014-12-15 MED ORDER — ZOLPIDEM TARTRATE 5 MG PO TABS: 5.0000 mg | ORAL_TABLET | Freq: Every evening | ORAL | Status: DC | PRN

## 2014-12-15 MED ORDER — MORPHINE SULFATE (PF) 1 MG/ML IV SOLN
INTRAVENOUS | Status: AC
  Filled 2014-12-15: qty 25

## 2014-12-15 MED ORDER — DIPHENHYDRAMINE HCL 12.5 MG/5ML PO ELIX: 12.5000 mg | ORAL_SOLUTION | Freq: Four times a day (QID) | ORAL | Status: DC | PRN

## 2014-12-15 MED ORDER — ALUM & MAG HYDROXIDE-SIMETH 200-200-20 MG/5ML PO SUSP: 30.0000 mL | Freq: Four times a day (QID) | ORAL | Status: DC | PRN

## 2014-12-15 MED ORDER — DOCUSATE SODIUM 100 MG PO CAPS
100.0000 mg | ORAL_CAPSULE | Freq: Two times a day (BID) | ORAL | Status: DC
Start: 2014-12-15 — End: 2014-12-18
  Administered 2014-12-15 – 2014-12-18 (×6): 100 mg via ORAL
  Filled 2014-12-15 (×6): qty 1

## 2014-12-15 MED ORDER — NALOXONE HCL 0.4 MG/ML IJ SOLN: 0.4000 mg | INTRAMUSCULAR | Status: DC | PRN

## 2014-12-15 MED ORDER — BUPIVACAINE-EPINEPHRINE (PF) 0.25% -1:200000 IJ SOLN
INTRAMUSCULAR | Status: AC
  Filled 2014-12-15: qty 30

## 2014-12-15 MED ORDER — OXYCODONE-ACETAMINOPHEN 5-325 MG PO TABS
1.0000 | ORAL_TABLET | ORAL | Status: DC | PRN
  Administered 2014-12-15: 2 via ORAL
  Administered 2014-12-16 (×4): 1 via ORAL
  Administered 2014-12-17: 2 via ORAL
  Administered 2014-12-17 (×3): 1 via ORAL
  Administered 2014-12-17 – 2014-12-18 (×4): 2 via ORAL
  Filled 2014-12-15 (×3): qty 1
  Filled 2014-12-15 (×2): qty 2
  Filled 2014-12-15 (×3): qty 1
  Filled 2014-12-15 (×2): qty 2
  Filled 2014-12-15: qty 1
  Filled 2014-12-15: qty 2

## 2014-12-15 MED ORDER — FENTANYL CITRATE 0.05 MG/ML IJ SOLN: 25.0000 ug | INTRAMUSCULAR | Status: DC | PRN

## 2014-12-15 MED ORDER — THROMBIN 20000 UNITS EX SOLR
CUTANEOUS | Status: AC
  Filled 2014-12-15: qty 40000

## 2014-12-15 MED ORDER — ROCURONIUM BROMIDE 100 MG/10ML IV SOLN
INTRAVENOUS | Status: DC | PRN
  Administered 2014-12-15: 10 mg via INTRAVENOUS
  Administered 2014-12-15: 30 mg via INTRAVENOUS

## 2014-12-15 MED ORDER — LIDOCAINE HCL (CARDIAC) 20 MG/ML IV SOLN
INTRAVENOUS | Status: AC
  Filled 2014-12-15: qty 5

## 2014-12-15 MED ORDER — NEOSTIGMINE METHYLSULFATE 10 MG/10ML IV SOLN
INTRAVENOUS | Status: DC | PRN
  Administered 2014-12-15: 3 mg via INTRAVENOUS

## 2014-12-15 MED ORDER — ROCURONIUM BROMIDE 50 MG/5ML IV SOLN
INTRAVENOUS | Status: AC
  Filled 2014-12-15: qty 1

## 2014-12-15 MED ORDER — DIPHENHYDRAMINE HCL 50 MG/ML IJ SOLN
12.5000 mg | Freq: Four times a day (QID) | INTRAMUSCULAR | Status: DC | PRN
Start: 2014-12-15 — End: 2014-12-16

## 2014-12-15 MED ORDER — LACTATED RINGERS IV SOLN
INTRAVENOUS | Status: DC | PRN
  Administered 2014-12-15 (×3): via INTRAVENOUS

## 2014-12-15 MED ORDER — FLEET ENEMA 7-19 GM/118ML RE ENEM: 1.0000 | ENEMA | Freq: Once | RECTAL | Status: AC | PRN

## 2014-12-15 MED ORDER — BRIMONIDINE TARTRATE-TIMOLOL 0.2-0.5 % OP SOLN: 1.0000 [drp] | Freq: Two times a day (BID) | OPHTHALMIC | Status: DC

## 2014-12-15 MED ORDER — SUCCINYLCHOLINE CHLORIDE 20 MG/ML IJ SOLN
INTRAMUSCULAR | Status: DC | PRN
  Administered 2014-12-15: 100 mg via INTRAVENOUS

## 2014-12-15 MED ORDER — MEPERIDINE HCL 25 MG/ML IJ SOLN: 6.2500 mg | INTRAMUSCULAR | Status: DC | PRN

## 2014-12-15 MED ORDER — SODIUM CHLORIDE 0.9 % IJ SOLN: 9.0000 mL | INTRAMUSCULAR | Status: DC | PRN

## 2014-12-15 MED ORDER — FENTANYL CITRATE 0.05 MG/ML IJ SOLN
INTRAMUSCULAR | Status: DC | PRN
  Administered 2014-12-15 (×3): 50 ug via INTRAVENOUS
  Administered 2014-12-15: 100 ug via INTRAVENOUS
  Administered 2014-12-15 (×2): 50 ug via INTRAVENOUS

## 2014-12-15 MED ORDER — MORPHINE SULFATE 2 MG/ML IJ SOLN: 1.0000 mg | INTRAMUSCULAR | Status: DC | PRN

## 2014-12-15 MED ORDER — PROMETHAZINE HCL 25 MG/ML IJ SOLN: 6.2500 mg | INTRAMUSCULAR | Status: DC | PRN

## 2014-12-15 SURGICAL SUPPLY — 82 items
BENZOIN TINCTURE PRP APPL 2/3 (GAUZE/BANDAGES/DRESSINGS) ×3 IMPLANT
BLADE SURG ROTATE 9660 (MISCELLANEOUS) IMPLANT
BUR PRESCISION 1.7 ELITE (BURR) IMPLANT
BUR ROUND PRECISION 4.0 (BURR) ×2 IMPLANT
BUR ROUND PRECISION 4.0MM (BURR) ×1
CAGE CONCORDE BULLET 9X7X27 (Cage) ×3 IMPLANT
CARTRIDGE OIL MAESTRO DRILL (MISCELLANEOUS) ×1 IMPLANT
CLOSURE WOUND 1/2 X4 (GAUZE/BANDAGES/DRESSINGS) ×1
CONT SPEC STER OR (MISCELLANEOUS) ×3 IMPLANT
COVER MAYO STAND STRL (DRAPES) ×6 IMPLANT
COVER SURGICAL LIGHT HANDLE (MISCELLANEOUS) ×3 IMPLANT
DIFFUSER DRILL AIR PNEUMATIC (MISCELLANEOUS) ×3 IMPLANT
DRAIN CHANNEL 15F RND FF W/TCR (WOUND CARE) IMPLANT
DRAPE C-ARM 42X72 X-RAY (DRAPES) ×3 IMPLANT
DRAPE C-ARMOR (DRAPES) ×3 IMPLANT
DRAPE ORTHO SPLIT 77X108 STRL (DRAPES) ×2
DRAPE POUCH INSTRU U-SHP 10X18 (DRAPES) ×3 IMPLANT
DRAPE SURG 17X23 STRL (DRAPES) ×12 IMPLANT
DRAPE SURG ORHT 6 SPLT 77X108 (DRAPES) ×1 IMPLANT
DURAPREP 26ML APPLICATOR (WOUND CARE) ×3 IMPLANT
ELECT BLADE 4.0 EZ CLEAN MEGAD (MISCELLANEOUS) ×3
ELECT CAUTERY BLADE 6.4 (BLADE) ×3 IMPLANT
ELECT REM PT RETURN 9FT ADLT (ELECTROSURGICAL) ×3
ELECTRODE BLDE 4.0 EZ CLN MEGD (MISCELLANEOUS) ×1 IMPLANT
ELECTRODE REM PT RTRN 9FT ADLT (ELECTROSURGICAL) ×1 IMPLANT
EVACUATOR SILICONE 100CC (DRAIN) IMPLANT
GAUZE SPONGE 4X4 12PLY STRL (GAUZE/BANDAGES/DRESSINGS) ×3 IMPLANT
GAUZE SPONGE 4X4 16PLY XRAY LF (GAUZE/BANDAGES/DRESSINGS) ×6 IMPLANT
GLOVE BIO SURGEON STRL SZ7 (GLOVE) ×3 IMPLANT
GLOVE BIO SURGEON STRL SZ8 (GLOVE) ×3 IMPLANT
GLOVE BIOGEL PI IND STRL 7.0 (GLOVE) ×1 IMPLANT
GLOVE BIOGEL PI IND STRL 8 (GLOVE) ×1 IMPLANT
GLOVE BIOGEL PI INDICATOR 7.0 (GLOVE) ×2
GLOVE BIOGEL PI INDICATOR 8 (GLOVE) ×2
GOWN STRL REUS W/ TWL LRG LVL3 (GOWN DISPOSABLE) ×2 IMPLANT
GOWN STRL REUS W/ TWL XL LVL3 (GOWN DISPOSABLE) ×1 IMPLANT
GOWN STRL REUS W/TWL LRG LVL3 (GOWN DISPOSABLE) ×4
GOWN STRL REUS W/TWL XL LVL3 (GOWN DISPOSABLE) ×2
IV CATH 14GX2 1/4 (CATHETERS) ×3 IMPLANT
KIT BASIN OR (CUSTOM PROCEDURE TRAY) ×3 IMPLANT
KIT POSITION SURG JACKSON T1 (MISCELLANEOUS) ×3 IMPLANT
KIT ROOM TURNOVER OR (KITS) ×3 IMPLANT
MARKER SKIN DUAL TIP RULER LAB (MISCELLANEOUS) ×6 IMPLANT
MIX DBX 10CC 35% BONE (Bone Implant) ×3 IMPLANT
NDL SAFETY ECLIPSE 18X1.5 (NEEDLE) ×1 IMPLANT
NEEDLE 22X1 1/2 (OR ONLY) (NEEDLE) ×3 IMPLANT
NEEDLE BONE MARROW 8GX6 FENEST (NEEDLE) IMPLANT
NEEDLE HYPO 18GX1.5 SHARP (NEEDLE) ×2
NEEDLE HYPO 25GX1X1/2 BEV (NEEDLE) ×3 IMPLANT
NEEDLE SPNL 18GX3.5 QUINCKE PK (NEEDLE) ×6 IMPLANT
NEURO MONITORING STIM (LABOR (TRAVEL & OVERTIME)) ×3 IMPLANT
NS IRRIG 1000ML POUR BTL (IV SOLUTION) ×3 IMPLANT
OIL CARTRIDGE MAESTRO DRILL (MISCELLANEOUS) ×3
PACK LAMINECTOMY ORTHO (CUSTOM PROCEDURE TRAY) ×3 IMPLANT
PACK UNIVERSAL I (CUSTOM PROCEDURE TRAY) ×3 IMPLANT
PAD ARMBOARD 7.5X6 YLW CONV (MISCELLANEOUS) ×3 IMPLANT
PATTIES SURGICAL .5 X1 (DISPOSABLE) ×3 IMPLANT
PATTIES SURGICAL .5X1.5 (GAUZE/BANDAGES/DRESSINGS) ×3 IMPLANT
ROD PRE BENT EXP 40MM (Rod) ×6 IMPLANT
SCREW POLYAXIAL 7X45MM (Screw) ×12 IMPLANT
SCREW SET SINGLE INNER (Screw) ×12 IMPLANT
SPONGE INTESTINAL PEANUT (DISPOSABLE) ×3 IMPLANT
SPONGE LAP 4X18 X RAY DECT (DISPOSABLE) ×6 IMPLANT
SPONGE SURGIFOAM ABS GEL 100 (HEMOSTASIS) ×3 IMPLANT
STRIP CLOSURE SKIN 1/2X4 (GAUZE/BANDAGES/DRESSINGS) ×2 IMPLANT
SURGIFLO TRUKIT (HEMOSTASIS) IMPLANT
SUT BONE WAX W31G (SUTURE) ×3 IMPLANT
SUT MNCRL AB 4-0 PS2 18 (SUTURE) ×3 IMPLANT
SUT VIC AB 0 CT1 18XCR BRD 8 (SUTURE) ×2 IMPLANT
SUT VIC AB 0 CT1 8-18 (SUTURE) ×4
SUT VIC AB 1 CT1 18XCR BRD 8 (SUTURE) ×1 IMPLANT
SUT VIC AB 1 CT1 8-18 (SUTURE) ×2
SUT VIC AB 2-0 CT2 18 VCP726D (SUTURE) ×3 IMPLANT
SYR 20CC LL (SYRINGE) ×3 IMPLANT
SYR BULB IRRIGATION 50ML (SYRINGE) ×3 IMPLANT
SYR CONTROL 10ML LL (SYRINGE) ×6 IMPLANT
SYR TB 1ML LUER SLIP (SYRINGE) ×3 IMPLANT
TOWEL OR 17X24 6PK STRL BLUE (TOWEL DISPOSABLE) ×3 IMPLANT
TOWEL OR 17X26 10 PK STRL BLUE (TOWEL DISPOSABLE) ×3 IMPLANT
TRAY FOLEY CATH 16FRSI W/METER (SET/KITS/TRAYS/PACK) ×3 IMPLANT
WATER STERILE IRR 1000ML POUR (IV SOLUTION) IMPLANT
YANKAUER SUCT BULB TIP NO VENT (SUCTIONS) ×3 IMPLANT
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
## 2014-12-15 NOTE — Op Note (Signed)
[name], [name]             ACCOUNT NO.:  [id number]  MEDICAL RECORD NO.:  [id number]  LOCATION:  [id number]                        FACILITY:  [facility]  PHYSICIAN:  Phylliss Bob, MD      DATE OF BIRTH:  02/06/1951  DATE OF PROCEDURE:  12/15/2014                              OPERATIVE REPORT   PREOPERATIVE DIAGNOSES: 1. Left-sided L5 radiculopathy. 2. L4-5 spinal stenosis. 3. L5-S1 spinal stenosis. 4. Status post previous L5-S1 decompression.  POSTOPERATIVE DIAGNOSES: 1. Left-sided L5 radiculopathy. 2. L4-5 spinal stenosis. 3. L5-S1 spinal stenosis. 4. Status post previous L5-S1 decompression.  PROCEDURE: 1. Left-sided L5-S1 transforaminal lumbar interbody fusion. 2. Right-sided L5-S1 posterolateral fusion. 3. L5-S1 revision decompression, requiring more bone removal than what is     required for the interbody fusion aspect of the procedure. 4. L4-5 decompression. 5. Placement of posterior instrumentation (7 x 45 mm screws). 6. Insertion of interbody device x1 (7 x 27 mm Concorde bullet cage). 7. Use of local autograft. 8. Use of morselized allograft. 9. Intraoperative use of fluoroscopy.  SURGEON:  Phylliss Bob, MD.  ASSISTANTPricilla Holm, PA-C  ANESTHESIA:  General endotracheal anesthesia.  COMPLICATIONS:  None.  DISPOSITION:  Stable.  ESTIMATED BLOOD LOSS:  200 mL.  INDICATIONS FOR SURGERY:  Briefly, Mr. Mitro is a very pleasant 64- year-old male, who did present to me with severe and ongoing pain in the left leg.  He did also have numbness.  Of note, the patient is status post a previous L5-S1 decompressive procedure.  An updated MRI did reveal severe neural foraminal stenosis on the left at L5-S1, as well as lateral recess stenosis at L4-5.  Given the patient's ongoing pain and lack of failure with multiple forms of conservative care, we did discuss proceeding with the procedure noted above.  The patient did elect to proceed.  OPERATIVE DETAILS:   On December 15, 2014, the patient was brought to surgery and general endotracheal anesthesia was administered.  The patient was placed prone on a well-padded flat Jackson bed with the spinal frame.  Antibiotics were given and a time-out procedure was performed.  The back was prepped and draped.  A midline incision was made from the L4 to the S1 level.  The fascia was incised in the midline and the paraspinal musculature was gently retracted.  A lateral radiograph confirmed the appropriate operative levels.  I then subperiosteally exposed the L5-S1 facet joints bilaterally.  Using anatomic landmarks, I did cannulate the L5 and S1 pedicles bilaterally. I did use a 6 mm tap.  A ball-tip probe was used to confirm that there was no cortical violation of the cannulated pedicles.  At this point, I did proceed with a revision decompression at the L5-S1 level.  There was abundant scar tissue noted at the left side of the of the L5-S1 intervertebral space, however, I was able to successfully performed bilateral lateral recess decompression at L5-S1.  The decompression was then carried up to the L4-5 level.  There was noted to be substantial facet hypertrophy at this level, which was removed.  In doing so, I was able to fully decompress the bilateral L5 nerves successfully.  I was very pleased with the  decompression I was able to accomplish.  I then used a high-speed bur to decorticate the posterior elements and the posterolateral gutter on the right side at the L5-S1 level.  Autograft as well as allograft in the form of the DBX mix was packed in the posterolateral gutter on the right.  A 7 x 45 mm screws were placed.  A rod was secured into the screws and caps were placed and I did perform a distraction maneuver across the rod to help optimize my exposure to the L5-S1 intervertebral space.  I then turned my attention towards the left side.  There was noted to be severe left-sided neural  foraminal stenosis.  I was able to perform a thorough and complete facetectomy on the left side.  I proceeded with a thorough and complete lateral recess decompression bilaterally.  Again, there was abundant scar noted from the patient's previous surgery, however, I was able to safely and thoroughly decompressed the exiting L5 nerve in the region of the neural foramen.  With an assistant holding medial retraction of the traversing S1 nerve, I did use a 15-blade knife to perform an annulotomy at the posterolateral aspect of the left side of the L5-S1 intervertebral space.  I then used a series of curettes and pituitary rongeurs to perform a thorough and complete diskectomy.  The endplates were appropriately prepared.  I then placed a series of trials and I did feel that a 7 x 27 mm intervertebral spacer would be the most appropriate fit.  The intervertebral space was then packed liberally with autograft and allograft as was the intervertebral spacer.  The intervertebral spacer was then tamped into position in the usual fashion.  I was very pleased with the press fit of the implant, and the appearance of the implant on both AP and lateral fluoroscopic images.  Of note, prior to placing the bone graft, I did liberally irrigate the wound with approximately 3 L of normal saline.  I then placed 7 x 45 mm screws at L5 and at S1.  Again, a rod was placed.  Distraction was discontinued on the contralateral side.  Caps were placed and a final locking procedure was performed on both the right and on the left sides.  I was very pleased with the final AP and lateral fluoroscopic images.  Of note, there was no sustained abnormal significant EMG activity noted throughout the surgery.  There was no extravasation of cerebrospinal fluid noted throughout the surgery.  The wound was then explored for any undue bleeding and there was none encountered.  I then closed the fascia using #1 Vicryl.  The  subcutaneous layer was closed using 2-0 Vicryl followed by 3-0 Monocryl.  Benzoin and Steri-Strips were applied followed by sterile dressing.  All instrument counts were correct at the termination of the procedure.  Of note, Pricilla Holm was my assistant throughout surgery, and did aid in retraction, suctioning, and closure.     Phylliss Bob, MD     MD/MEDQ  D:  12/15/2014  T:  12/15/2014  Job:  [id number]  cc:   Henrine Screws, M.D.

## 2014-12-15 NOTE — Anesthesia Preprocedure Evaluation (Addendum)
Anesthesia Evaluation  Patient identified by MRN, date of birth, ID band Patient awake    Reviewed: Allergy & Precautions, NPO status , Patient's Chart, lab work & pertinent test results, reviewed documented beta blocker date and time   Airway Mallampati: II   Neck ROM: Full    Dental  (+) Teeth Intact, Dental Advisory Given   Pulmonary former smoker,  breath sounds clear to auscultation        Cardiovascular Exercise Tolerance: Good hypertension, Pt. on medications Rhythm:Regular  EKG 11/2014 no significant change, possible old MI   Neuro/Psych    GI/Hepatic negative GI ROS, Neg liver ROS,   Endo/Other    Renal/GU GFR 70     Musculoskeletal   Abdominal (+)  Abdomen: soft.    Peds  Hematology H/H 15/46   Anesthesia Other Findings   Reproductive/Obstetrics                            Anesthesia Physical Anesthesia Plan  ASA: II  Anesthesia Plan: General   Post-op Pain Management:    Induction: Intravenous  Airway Management Planned: Oral ETT  Additional Equipment:   Intra-op Plan:   Post-operative Plan: Extubation in OR  Informed Consent: I have reviewed the patients History and Physical, chart, labs and discussed the procedure including the risks, benefits and alternatives for the proposed anesthesia with the patient or authorized representative who has indicated his/her understanding and acceptance.     Plan Discussed with:   Anesthesia Plan Comments: (2nd IV after turning)        Anesthesia Quick Evaluation

## 2014-12-15 NOTE — Progress Notes (Signed)
Utilization review completed.  

## 2014-12-15 NOTE — Transfer of Care (Signed)
Immediate Anesthesia Transfer of Care Note  Patient: [name]  Procedure(s) Performed: Procedure(s) with comments: POSTERIOR LUMBAR FUSION 1 LEVEL (Left) - Lumbar 4-5, lumbar 5-sacrum 1 decompression, Left sided lumbar 5-sacrum 1 Transforaminal lumbar interbody fusion with instrumentation and allograft  Patient Location: PACU  Anesthesia Type:General  Level of Consciousness: awake  Airway & Oxygen Therapy: Patient Spontanous Breathing and Patient connected to nasal cannula oxygen  Post-op Assessment: Report given to PACU RN and Post -op Vital signs reviewed and stable  Post vital signs: Reviewed and stable  Complications: No apparent anesthesia complications

## 2014-12-15 NOTE — Anesthesia Postprocedure Evaluation (Signed)
  Anesthesia Post-op Note  Patient: [name]  Procedure(s) Performed: Procedure(s) with comments: POSTERIOR LUMBAR FUSION 1 LEVEL (Left) - Lumbar 4-5, lumbar 5-sacrum 1 decompression, Left sided lumbar 5-sacrum 1 Transforaminal lumbar interbody fusion with instrumentation and allograft  Patient Location: PACU  Anesthesia Type:General  Level of Consciousness: awake and sedated  Airway and Oxygen Therapy: Patient Spontanous Breathing and Patient connected to nasal cannula oxygen  Post-op Pain: mild  Post-op Assessment: Post-op Vital signs reviewed  Post-op Vital Signs: Reviewed and stable  Last Vitals:  Filed Vitals:   12/15/14 1358  BP: 132/83  Pulse: 81  Temp:   Resp: 22    Complications: No apparent anesthesia complications

## 2014-12-16 NOTE — Progress Notes (Signed)
Foley removed. Pt tolerated well.

## 2014-12-16 NOTE — Evaluation (Signed)
Occupational Therapy Evaluation Patient Details Name: [name] MRN: [id number] DOB: July 01, 1951 Today's Date: 12/16/2014    History of Present Illness s/p L4-S1 decompression, L5/S1 fusion   Clinical Impression   Patient independent PTA. Patient currently requires up to total assist for LB ADLs, and min assist for functional mobility and transfers. Patient requires min verbal cues in order to adhere to back precautions during ADL and functional mobility. Patient will benefit from acute OT to increase overall independence in the areas of ADLs, functional mobility, and adhering to back precautions in order to safely discharge home. Will need to educate patient and wife on hip kit (reacher, sock aid, LH sponge, LH shoe horn) AND donning/doffing of TLSO.     Follow Up Recommendations  No OT follow up;Supervision/Assistance - 24 hour    Equipment Recommendations  3 in 1 bedside comode    Recommendations for Other Services  None at this time.     Precautions / Restrictions Precautions Precautions: Fall;Back Precaution Comments: Reviewed back precautions and administerred handout for patient and wife to review Restrictions Weight Bearing Restrictions: No      Mobility Bed Mobility Overal bed mobility: Needs Assistance Bed Mobility: Rolling;Supine to Sit Rolling: Min guard   Supine to sit: Min guard     General bed mobility comments: Requires verbal cues to maintain back precautions. Used bed rails.  Transfers Overall transfer level: Needs assistance Equipment used: Rolling walker (2 wheeled) Transfers: Sit to/from Stand Sit to Stand: Min guard         General transfer comment: Cues to maintain back precautions and for safe sit<>stand technique and hand placement    Balance Defer to PT evaluation     ADL   Eating/Feeding: Independent   Grooming: Supervision/safety;Standing   Upper Body Bathing: Set up;Sitting   Lower Body Bathing: Total assistance;Sit  to/from stand;Adhering to back precautions;Cueing for safety   Upper Body Dressing : Set up;Sitting   Lower Body Dressing: Cueing for safety;Adhering to back precautions;Sit to/from stand   Toilet Transfer: Minimal assistance;RW;BSC           Functional mobility during ADLs: Minimal assistance General ADL Comments: Patient nauseated during session. Unable to cross BLEs for LB ADLs, recommending hip kit to increase independence. Education provided regarding donning/doffing of TLSO, will require assistance for this post discharge.                Pertinent Vitals/Pain Pain Assessment: 0-10 Pain Score: 7  Pain Location: back Pain Descriptors / Indicators: Aching Pain Intervention(s): Repositioned;Monitored during session     Hand Dominance Right   Extremity/Trunk Assessment Upper Extremity Assessment Upper Extremity Assessment: Overall WFL for tasks assessed   Lower Extremity Assessment Lower Extremity Assessment: Defer to PT evaluation   Cervical / Trunk Assessment Cervical / Trunk Assessment: Normal   Communication Communication Communication: No difficulties   Cognition Arousal/Alertness: Awake/alert Behavior During Therapy: WFL for tasks assessed/performed Overall Cognitive Status: Within Functional Limits for tasks assessed             Home Living Family/patient expects to be discharged to:: Private residence Living Arrangements: Spouse/significant other;Other relatives (wife and grandchildren) Available Help at Discharge: Family;Available 24 hours/day Type of Home: House Home Access: Stairs to enter [employer] of Steps: 2 steps Entrance Stairs-Rails: None Home Layout: Two level;Bed/bath upstairs;1/2 bath on main level Alternate Level Stairs-Number of Steps: flight Alternate Level Stairs-Rails: Can reach both (3/4 of the way, on right rest of the way ) Bathroom Shower/Tub: Walk-in  shower;Door   [employer] Toilet: Standard Bathroom  Accessibility: Yes How Accessible: Accessible via walker Home Equipment: Cane - single point          Prior Functioning/Environment Level of Independence: Independent        Comments: Patient driving independently. 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    OT Diagnosis: Generalized weakness;Acute pain   OT Problem List: Decreased strength;Decreased activity tolerance;Decreased safety awareness;Decreased knowledge of use of DME or AE;Decreased knowledge of precautions;Pain   OT Treatment/Interventions: Self-care/ADL training;Energy conservation;DME and/or AE instruction;Therapeutic activities;Patient/family education    OT Goals(Current goals can be found in the care plan section) Acute Rehab OT Goals Patient Stated Goal: get stronger OT Goal Formulation: With patient Time For Goal Achievement: 12/30/14 Potential to Achieve Goals: Good ADL Goals Pt Will Perform Lower Body Bathing: with supervision;with caregiver independent in assisting;with adaptive equipment;sit to/from stand Pt Will Perform Lower Body Dressing: with supervision;with caregiver independent in assisting;with adaptive equipment;sit to/from stand Pt Will Transfer to Toilet: with supervision;bedside commode;ambulating Pt Will Perform Toileting - Clothing Manipulation and hygiene: with supervision;with caregiver independent in assisting;with adaptive equipment;sit to/from stand Pt Will Perform Tub/Shower Transfer: with supervision;rolling walker;3 in 1;ambulating Additional ADL Goal #1: Patient's caregiver will be independent with donning/doffing of TLSO  OT Frequency: Min 2X/week   Barriers to D/C: None known at this time          End of Session Equipment Utilized During Treatment: Rolling walker;Back brace Nurse Communication: Mobility status (education on donning/doffing of TLSO)  Activity Tolerance: Patient tolerated treatment well Patient left: in chair;with call bell/phone within  reach;with nursing/sitter in room   Time: 0820-0900 OT Time Calculation (min): 40 min Charges:  OT General Charges $OT Visit: 1 Procedure OT Evaluation $Initial OT Evaluation Tier I: 1 Procedure OT Treatments $Self Care/Home Management : 8-22 mins $Therapeutic Activity: 8-22 mins  Keelon Zurn , MS, OTR/L, CLT Pager: [phone]  12/16/2014, 9:10 AM

## 2014-12-16 NOTE — Progress Notes (Signed)
    Patient doing well Minimal LBP Patient reports resolution of left leg pain   Physical Exam: Filed Vitals:   12/16/14 0500  BP: 119/70  Pulse: 87  Temp: 98.8 F (37.1 C)  Resp: 16    Dressing in place NVI  POD #1 s/p L4-S1 decompression, L5/S1 fusion  - up with PT/OT, encourage ambulation - Percocet for pain, Valium for muscle spasms - likely d/c home tomorrow

## 2014-12-16 NOTE — Evaluation (Signed)
Physical Therapy Evaluation Patient Details Name: [name] MRN: [id number] DOB: [birth year] Today's Date: 12/16/2014   History of Present Illness  s/p L4-S1 decompression, L5/S1 fusion  Clinical Impression  Pt demonstrates decreased overall strength and pain in low back.  Performed all gait and stairs with S, however did require min A for bed mobility to maintain back precautions.  Recommend continued acute services to address deficits, however do not feel he will need any follow up at D/C.     Follow Up Recommendations No PT follow up    Equipment Recommendations  Rolling walker with 5" wheels    Recommendations for Other Services       Precautions / Restrictions Precautions Precautions: Fall;Back Precaution Comments: Reviewed back precautions and administerred handout for patient and wife to review Required Braces or Orthoses: Spinal Brace Spinal Brace: Thoracolumbosacral orthotic;Applied in standing position;Other (comment) Spinal Brace Comments: Has TLSO with LLE component keeping L hip locked in neutral, can unlock to sit/stand, but per MD can ambulate to/from restroom without brace on.  Restrictions Weight Bearing Restrictions: No      Mobility  Bed Mobility Overal bed mobility: Needs Assistance Bed Mobility: Rolling;Sidelying to Sit;Sit to Sidelying Rolling: Supervision Sidelying to sit: Supervision Supine to sit: Min guard   Sit to sidelying: Min assist General bed mobility comments: Requires mod cues for maintaining back precautions, esp when getting back into bed with assist needed for LEs into bed.  Demonstrated to pt and wife how to prevent twisting when getting into bed.  Pt did very well getting to EOB at beginning of session with HOB flat and without rails to better simulate home.   Transfers Overall transfer level: Needs assistance Equipment used: Rolling walker (2 wheeled) Transfers: Sit to/from Stand Sit to Stand: Supervision         General  transfer comment: Min cues to maintain back precautions with sit<>stand.   Ambulation/Gait Ambulation/Gait assistance: Supervision Ambulation Distance (Feet): 200 Feet Assistive device: Rolling walker (2 wheeled) Gait Pattern/deviations: Step-through pattern;Decreased stride length Gait velocity: decreased   General Gait Details: note pt with TLSO with L LE component keeping L hip locked at neutral.  Pt with slight hip hike due to this, but otherwise does well maintaining upright posture.   Stairs Stairs: Yes Stairs assistance: Supervision Stair Management: One rail Right Number of Stairs: 12 General stair comments: had pt perform stairs with single handrail to semi simulate home.  Pt did very well in step to fashion, keeping L hip locked in neutral.   Wheelchair Mobility    Modified Rankin (Stroke Patients Only)       Balance                                             Pertinent Vitals/Pain Pain Assessment: 0-10 Pain Score: 8  Pain Location: back Pain Descriptors / Indicators: Aching Pain Intervention(s): Repositioned    Home Living Family/patient expects to be discharged to:: Private residence Living Arrangements: Spouse/significant other;Other relatives (wife and grandchildren) Available Help at Discharge: Family;Available 24 hours/day Type of Home: House Home Access: Stairs to enter Entrance Stairs-Rails: None Entrance Stairs-Number of Steps: 1 step then 1 "stoop"  Home Layout: Two level;Bed/bath upstairs;1/2 bath on main level Home Equipment: Cane - single point      Prior Function Level of Independence: Independent  Comments: Patient driving independently. 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     Hand Dominance   Dominant Hand: Right    Extremity/Trunk Assessment   Upper Extremity Assessment: Overall WFL for tasks assessed           Lower Extremity Assessment: Overall WFL for tasks  assessed      Cervical / Trunk Assessment: Normal  Communication   Communication: No difficulties  Cognition Arousal/Alertness: Awake/alert Behavior During Therapy: WFL for tasks assessed/performed Overall Cognitive Status: Within Functional Limits for tasks assessed                      General Comments      Exercises        Assessment/Plan    PT Assessment Patient needs continued PT services  PT Diagnosis Difficulty walking;Generalized weakness;Acute pain   PT Problem List Decreased strength;Decreased activity tolerance;Decreased balance;Decreased mobility;Decreased knowledge of precautions;Pain  PT Treatment Interventions DME instruction;Gait training;Stair training;Functional mobility training;Therapeutic activities;Therapeutic exercise;Balance training;Patient/family education   PT Goals (Current goals can be found in the Care Plan section) Acute Rehab PT Goals Patient Stated Goal: get stronger PT Goal Formulation: With patient/family Time For Goal Achievement: 12/21/14 Potential to Achieve Goals: Good    Frequency Min 5X/week   Barriers to discharge        Co-evaluation               End of Session Equipment Utilized During Treatment: Back brace Activity Tolerance: Patient tolerated treatment well Patient left: in bed;with call bell/phone within reach;with family/visitor present Nurse Communication: Mobility status         Time: 7322-0254 PT Time Calculation (min) (ACUTE ONLY): 42 min   Charges:   PT Evaluation $Initial PT Evaluation Tier I: 1 Procedure PT Treatments $Gait Training: 23-37 mins   PT G CodesDenice Bors 12/16/2014, 10:31 AM

## 2014-12-17 MED FILL — Sodium Chloride Irrigation Soln 0.9%: Qty: 3000 | Status: AC

## 2014-12-17 MED FILL — Sodium Chloride IV Soln 0.9%: INTRAVENOUS | Qty: 1000 | Status: AC

## 2014-12-17 MED FILL — Heparin Sodium (Porcine) Inj 1000 Unit/ML: INTRAMUSCULAR | Qty: 30 | Status: AC

## 2014-12-17 NOTE — Progress Notes (Signed)
Occupational Therapy Treatment Patient Details Name: [name] MRN: [id number] DOB: July 31, 1951 Today's Date: 12/17/2014    History of present illness s/p L4-S1 decompression, L5/S1 fusion   OT comments  Patient progressing towards OT goals, continue plan of care for now. Patient will continue to benefit from skilled acute OT for education to pt and wife regarding back precautions, donning/doffing of TLSO with leg component, use of AE for LB ADLs, and functional mobility/transfers.    Follow Up Recommendations  No OT follow up;Supervision/Assistance - 24 hour    Equipment Recommendations  3 in 1 bedside comode    Recommendations for Other Services  None at this time    Precautions / Restrictions Precautions Precautions: Fall;Back Precaution Comments: Pt able to verbalize 2/3 back precautions. Required Braces or Orthoses: Spinal Brace Spinal Brace: Thoracolumbosacral orthotic;Applied in standing position;Other (comment) Spinal Brace Comments: Has TLSO with LLE component keeping L hip locked in neutral, can unlock to sit/stand, but per MD can ambulate to/from restroom without brace on.  Restrictions Weight Bearing Restrictions: No    Mobility - Per PT note Bed Mobility Overal bed mobility: Needs Assistance Bed Mobility: Rolling;Sidelying to Sit;Sit to Sidelying Rolling: Supervision Sidelying to sit: Min assist     Sit to sidelying: Min assist General bed mobility comments: Cues to maintain back precautions during transition utilizing log roll technique. Requires assist with elevating trunk and to bring LEs into bed. HOB flat, no use of rails to simulate home environment.  Transfers Overall transfer level: Needs assistance Equipment used: Rolling walker (2 wheeled) Transfers: Sit to/from Stand Sit to Stand: Min assist;Min guard         General transfer comment: Min A to rise from chair x2 due to difficulty with transition of hands from chair to RW. Unsteady during  transition. Stood from [employer]. Min guard to stand from EOB due to elevated surface compared to chair.    Balance - Per PT note Overall balance assessment: Needs assistance Sitting-balance support: Feet supported;No upper extremity supported Sitting balance-Leahy Scale: Good     Standing balance support: During functional activity Standing balance-Leahy Scale: Fair Standing balance comment: Able to assist with donning PJs in standing without LOB.    ADL Overall ADL's : Needs assistance/impaired Eating/Feeding: Independent   Grooming: Supervision/safety;Standing   Upper Body Bathing: Set up;Sitting   Lower Body Bathing: Min guard;Sit to/from stand;Cueing for safety;Adhering to back precautions;With adaptive equipment   Upper Body Dressing : Set up;Sitting   Lower Body Dressing: Cueing for safety;Min guard;With adaptive equipment;Sit to/from stand     General ADL Comments: Educated patient on use of hip kit (reacher, sock aid, LH shoe horn, LH sponge). Patient with increased "soreness" and decreased endurance today. Thinks he may have "over done it" this am (stated he was up in recliner for 3 hrs, then worked with PT). Encouraged patient to get his rest and left patient in bed at end of session.                 Cognition   Behavior During Therapy: WFL for tasks assessed/performed Overall Cognitive Status: Within Functional Limits for tasks assessed       Memory: Decreased recall of precautions                  Pertinent Vitals/ Pain       Pain Assessment: 0-10 Pain Score: 8  Pain Location: back Pain Descriptors / Indicators: Sore Pain Intervention(s): Monitored during session;Repositioned  Frequency Min 2X/week     Progress Toward Goals  OT Goals(current goals can now be found in the care plan section)  Progress towards OT goals: Progressing toward goals    Plan Discharge plan remains appropriate       End of Session Equipment Utilized During  Treatment: Rolling walker;Back brace   Activity Tolerance Patient tolerated treatment well   Patient Left in bed;with call bell/phone within reach;with bed alarm set     Time: 1100-3496 OT Time Calculation (min): 19 min  Charges: OT General Charges $OT Visit: 1 Procedure OT Treatments $Self Care/Home Management : 8-22 mins  Farheen Pfahler , MS, OTR/L, CLT Pager: [phone]  12/17/2014, 10:47 AM

## 2014-12-17 NOTE — Progress Notes (Signed)
Pt explained to RN, Joni, that he had no transportation for discharge today, and his pain is not controlled.  Call placed to Dr. Lynann Bologna who stated to keep him until tomorrow.  Will continue to monitor.

## 2014-12-17 NOTE — Progress Notes (Signed)
Physical Therapy Treatment Patient Details Name: [name] MRN: [id number] DOB: [birth year] Today's Date: 12/17/2014    History of Present Illness s/p L4-S1 decompression, L5/S1 fusion    PT Comments    Patient progressing well with mobility. Improved ambulation distance. Continues to have some difficulty with sit<->stand transfers and bed mobility requiring Min A to complete. Understands back precautions and proper use of TLSO. Wife not present to practice donning TLSO. Will continue to follow acutely.    Follow Up Recommendations  Supervision - Intermittent     Equipment Recommendations  Rolling walker with 5" wheels    Recommendations for Other Services       Precautions / Restrictions Precautions Precautions: Fall;Back Precaution Comments: Pt able to verbalize 2/3 back precautions. Required Braces or Orthoses: Spinal Brace Spinal Brace: Thoracolumbosacral orthotic;Applied in standing position;Other (comment) Spinal Brace Comments: Has TLSO with LLE component keeping L hip locked in neutral, can unlock to sit/stand, but per MD can ambulate to/from restroom without brace on.  Restrictions Weight Bearing Restrictions: No    Mobility  Bed Mobility Overal bed mobility: Needs Assistance Bed Mobility: Rolling;Sidelying to Sit;Sit to Sidelying Rolling: Supervision Sidelying to sit: Min assist     Sit to sidelying: Min assist General bed mobility comments: Cues to maintain back precautions during transition utilizing log roll technique. Requires assist with elevating trunk and to bring LEs into bed. HOB flat, no use of rails to simulate home environment.  Transfers Overall transfer level: Needs assistance Equipment used: Rolling walker (2 wheeled) Transfers: Sit to/from Stand Sit to Stand: Min assist;Min guard         General transfer comment: Min A to rise from chair x2 due to difficulty with transition of hands from chair to RW. Unsteady during transition.  Stood from [employer]. Min guard to stand from EOB due to elevated surface compared to chair.  Ambulation/Gait Ambulation/Gait assistance: Supervision Ambulation Distance (Feet): 250 Feet Assistive device: Rolling walker (2 wheeled) Gait Pattern/deviations: Step-through pattern;Decreased stride length Gait velocity: decreased   General Gait Details: note pt with TLSO with L LE component keeping L hip locked at neutral.  Pt with slight hip hike due to brace but otherwise does well maintaining upright posture.    Stairs Stairs: Yes Stairs assistance: Supervision Stair Management: One rail Right Number of Stairs: 12 General stair comments: Used 1 UE on rail to simulate steps at home. Demonstrated step to pattern.  Wheelchair Mobility    Modified Rankin (Stroke Patients Only)       Balance Overall balance assessment: Needs assistance Sitting-balance support: Feet supported;No upper extremity supported Sitting balance-Leahy Scale: Good     Standing balance support: During functional activity Standing balance-Leahy Scale: Fair Standing balance comment: Able to assist with donning PJs in standing without LOB.                     Cognition Arousal/Alertness: Awake/alert Behavior During Therapy: WFL for tasks assessed/performed Overall Cognitive Status: Within Functional Limits for tasks assessed       Memory: Decreased recall of precautions              Exercises      General Comments        Pertinent Vitals/Pain Pain Assessment: 0-10 Pain Score: 6  Pain Location: back at surgical site Pain Descriptors / Indicators: Sore Pain Intervention(s): Monitored during session;Repositioned    Home Living  Prior Function            PT Goals (current goals can now be found in the care plan section) Progress towards PT goals: Progressing toward goals    Frequency  Min 5X/week    PT Plan Current plan remains appropriate     Co-evaluation             End of Session Equipment Utilized During Treatment: Gait belt Activity Tolerance: Patient tolerated treatment well Patient left: in bed;with call bell/phone within reach;with bed alarm set;with family/visitor present     Time: 9163-8466 PT Time Calculation (min) (ACUTE ONLY): 45 min  Charges:  $Gait Training: 23-37 mins $Therapeutic Activity: 8-22 mins                    G CodesCandy Sledge A 2014-12-24, 10:18 AM  Candy Sledge, PT, DPT [phone]

## 2014-12-17 NOTE — Progress Notes (Signed)
    Patient doing well, L leg pain is resolved, LBP well controlled, great progress in PT, tolerating TLSO brace well   Physical Exam: BP 110/69 mmHg  Pulse 117  Temp(Src) 98 F (36.7 C) (Axillary)  Resp 18  Ht 5\' 11"  (1.803 m)  Wt 89.086 kg (196 lb 6.4 oz)  BMI 27.40 kg/m2  SpO2 95%  Dressing in place NVI TLSO worn appropriately   POD #2 s/p L4-S1 decompression, L5/S1 fusion  - Resolved pre-op leg pain, well controlled PO LBP - up with PT/OT, encourage ambulation,  - Percocet for pain, Valium for muscle spasms - likely d/c home after PT today vs tomorrow pending PT and px control

## 2014-12-17 NOTE — Progress Notes (Signed)
As of this morning, patient was progressing well with therapy and pain appeared to be well-controlled. Plan was for discharge home today. However, I did just receive a call from patient's nurse Linus Orn, who informed me that patient does not have transportation to get home. It certainly would not be appropriate or comfortable for the patient to sit either outside or in the hospital lobby, so we will plan on keeping him in the hospital for another night and we will plan on discharge for tomorrow.

## 2014-12-18 NOTE — Progress Notes (Addendum)
NCM spoke to pt and states he has cane at home that he can use until [facility] can deliver his RW on Monday. States his wife will be at home to assist with his care. Contacted [facility] and they are not doing any DME delivers to hospital today. They will be able to ship or deliver to pt's home on Monday. Will follow up on 12/20/2014 for RW delivery to home. Jonnie Finner RN CCM Case Mgmt [phone]
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
## 2014-12-18 NOTE — Progress Notes (Signed)
Patient d/c home.  D/C instruction given, patient and caregiver verbalized understanding.
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
## 2014-12-21 NOTE — Progress Notes (Signed)
Pt received RW from [facility]. Wife picked up in the home store.  Jonnie Finner RN CCM Case Mgmt phone [phone]
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
## 2014-12-29 ENCOUNTER — Other Ambulatory Visit (HOSPITAL_COMMUNITY): Payer: Self-pay | Admitting: Orthopedic Surgery

## 2014-12-29 ENCOUNTER — Ambulatory Visit (HOSPITAL_COMMUNITY)
Admission: RE | Admit: 2014-12-29 | Discharge: 2014-12-29 | Disposition: A | Discharge status: Home / Self Care | Payer: Medicare Other | Source: Ambulatory Visit | Attending: Orthopedic Surgery | Admitting: Orthopedic Surgery

## 2014-12-29 DIAGNOSIS — Z9889 Other specified postprocedural states: Secondary | ICD-10-CM | POA: Diagnosis not present

## 2014-12-29 DIAGNOSIS — I82431 Acute embolism and thrombosis of right popliteal vein: Secondary | ICD-10-CM | POA: Insufficient documentation

## 2014-12-29 DIAGNOSIS — I82411 Acute embolism and thrombosis of right femoral vein: Secondary | ICD-10-CM | POA: Diagnosis not present

## 2014-12-29 DIAGNOSIS — M25561 Pain in right knee: Secondary | ICD-10-CM

## 2014-12-29 DIAGNOSIS — M79609 Pain in unspecified limb: Secondary | ICD-10-CM

## 2014-12-29 DIAGNOSIS — M4806 Spinal stenosis, lumbar region: Secondary | ICD-10-CM | POA: Diagnosis not present

## 2014-12-29 DIAGNOSIS — I82401 Acute embolism and thrombosis of unspecified deep veins of right lower extremity: Secondary | ICD-10-CM | POA: Diagnosis not present

## 2014-12-29 NOTE — Progress Notes (Signed)
VASCULAR LAB PRELIMINARY  PRELIMINARY  PRELIMINARY  PRELIMINARY  Bilateral lower extremity venous duplex completed.    Preliminary report:  Right:  DVT noted in the popliteal and femoral veins.  No evidence of superficial thrombosis.  No Baker's cyst. Left:  No evidence of DVT, superficial thrombosis, or Baker's cyst.  Milo Schreier, RVS 12/29/2014, 5:16 PM

## 2015-01-12 DIAGNOSIS — I82401 Acute embolism and thrombosis of unspecified deep veins of right lower extremity: Secondary | ICD-10-CM | POA: Diagnosis not present

## 2015-01-12 DIAGNOSIS — M5416 Radiculopathy, lumbar region: Secondary | ICD-10-CM | POA: Diagnosis not present

## 2015-01-19 DIAGNOSIS — R05 Cough: Secondary | ICD-10-CM | POA: Diagnosis not present

## 2015-01-19 DIAGNOSIS — R197 Diarrhea, unspecified: Secondary | ICD-10-CM | POA: Diagnosis not present

## 2015-01-19 DIAGNOSIS — R5382 Chronic fatigue, unspecified: Secondary | ICD-10-CM | POA: Diagnosis not present

## 2015-01-24 DIAGNOSIS — H4011X2 Primary open-angle glaucoma, moderate stage: Secondary | ICD-10-CM | POA: Diagnosis not present

## 2015-01-24 DIAGNOSIS — H4011X1 Primary open-angle glaucoma, mild stage: Secondary | ICD-10-CM | POA: Diagnosis not present

## 2015-01-25 DIAGNOSIS — Z9889 Other specified postprocedural states: Secondary | ICD-10-CM | POA: Diagnosis not present

## 2015-02-28 DIAGNOSIS — R5382 Chronic fatigue, unspecified: Secondary | ICD-10-CM | POA: Diagnosis not present

## 2015-02-28 DIAGNOSIS — Z23 Encounter for immunization: Secondary | ICD-10-CM | POA: Diagnosis not present

## 2015-02-28 DIAGNOSIS — M5416 Radiculopathy, lumbar region: Secondary | ICD-10-CM | POA: Diagnosis not present

## 2015-02-28 DIAGNOSIS — R05 Cough: Secondary | ICD-10-CM | POA: Diagnosis not present

## 2015-02-28 DIAGNOSIS — I82401 Acute embolism and thrombosis of unspecified deep veins of right lower extremity: Secondary | ICD-10-CM | POA: Diagnosis not present

## 2015-03-08 DIAGNOSIS — Z981 Arthrodesis status: Secondary | ICD-10-CM | POA: Diagnosis not present

## 2015-03-08 DIAGNOSIS — M545 Low back pain: Secondary | ICD-10-CM | POA: Diagnosis not present

## 2015-03-08 DIAGNOSIS — Z9889 Other specified postprocedural states: Secondary | ICD-10-CM | POA: Diagnosis not present

## 2015-03-08 DIAGNOSIS — M542 Cervicalgia: Secondary | ICD-10-CM | POA: Diagnosis not present

## 2015-03-09 DIAGNOSIS — Z981 Arthrodesis status: Secondary | ICD-10-CM | POA: Diagnosis not present

## 2015-03-09 DIAGNOSIS — M545 Low back pain: Secondary | ICD-10-CM | POA: Diagnosis not present

## 2015-03-09 DIAGNOSIS — M542 Cervicalgia: Secondary | ICD-10-CM | POA: Diagnosis not present

## 2015-03-22 DIAGNOSIS — M545 Low back pain: Secondary | ICD-10-CM | POA: Diagnosis not present

## 2015-03-22 DIAGNOSIS — Z981 Arthrodesis status: Secondary | ICD-10-CM | POA: Diagnosis not present

## 2015-03-22 DIAGNOSIS — M542 Cervicalgia: Secondary | ICD-10-CM | POA: Diagnosis not present

## 2015-03-24 DIAGNOSIS — Z981 Arthrodesis status: Secondary | ICD-10-CM | POA: Diagnosis not present

## 2015-03-24 DIAGNOSIS — M542 Cervicalgia: Secondary | ICD-10-CM | POA: Diagnosis not present

## 2015-03-29 DIAGNOSIS — M545 Low back pain: Secondary | ICD-10-CM | POA: Diagnosis not present

## 2015-03-29 DIAGNOSIS — M542 Cervicalgia: Secondary | ICD-10-CM | POA: Diagnosis not present

## 2015-03-29 DIAGNOSIS — Z981 Arthrodesis status: Secondary | ICD-10-CM | POA: Diagnosis not present

## 2015-03-31 DIAGNOSIS — M542 Cervicalgia: Secondary | ICD-10-CM | POA: Diagnosis not present

## 2015-03-31 DIAGNOSIS — Z981 Arthrodesis status: Secondary | ICD-10-CM | POA: Diagnosis not present

## 2015-03-31 DIAGNOSIS — M545 Low back pain: Secondary | ICD-10-CM | POA: Diagnosis not present

## 2015-04-01 DIAGNOSIS — C61 Malignant neoplasm of prostate: Secondary | ICD-10-CM | POA: Diagnosis not present

## 2015-04-05 DIAGNOSIS — Z981 Arthrodesis status: Secondary | ICD-10-CM | POA: Diagnosis not present

## 2015-04-05 DIAGNOSIS — M542 Cervicalgia: Secondary | ICD-10-CM | POA: Diagnosis not present

## 2015-04-05 DIAGNOSIS — M545 Low back pain: Secondary | ICD-10-CM | POA: Diagnosis not present

## 2015-04-07 DIAGNOSIS — M545 Low back pain: Secondary | ICD-10-CM | POA: Diagnosis not present

## 2015-04-07 DIAGNOSIS — M542 Cervicalgia: Secondary | ICD-10-CM | POA: Diagnosis not present

## 2015-04-07 DIAGNOSIS — Z981 Arthrodesis status: Secondary | ICD-10-CM | POA: Diagnosis not present

## 2015-04-08 DIAGNOSIS — C61 Malignant neoplasm of prostate: Secondary | ICD-10-CM | POA: Diagnosis not present

## 2015-04-08 DIAGNOSIS — N5201 Erectile dysfunction due to arterial insufficiency: Secondary | ICD-10-CM | POA: Diagnosis not present

## 2015-04-18 DIAGNOSIS — H4011X2 Primary open-angle glaucoma, moderate stage: Secondary | ICD-10-CM | POA: Diagnosis not present

## 2015-04-18 DIAGNOSIS — H4011X1 Primary open-angle glaucoma, mild stage: Secondary | ICD-10-CM | POA: Diagnosis not present

## 2015-05-15 IMAGING — CR DG LUMBAR SPINE 2-3V
2 series · 2 of 2 positions shown · non-contrast
Comparison: None.

CLINICAL DATA: Intraoperative localization for L4-5 TLIF

EXAM:
LUMBAR SPINE - 2-3 VIEW

[lateral (1 of 2)]
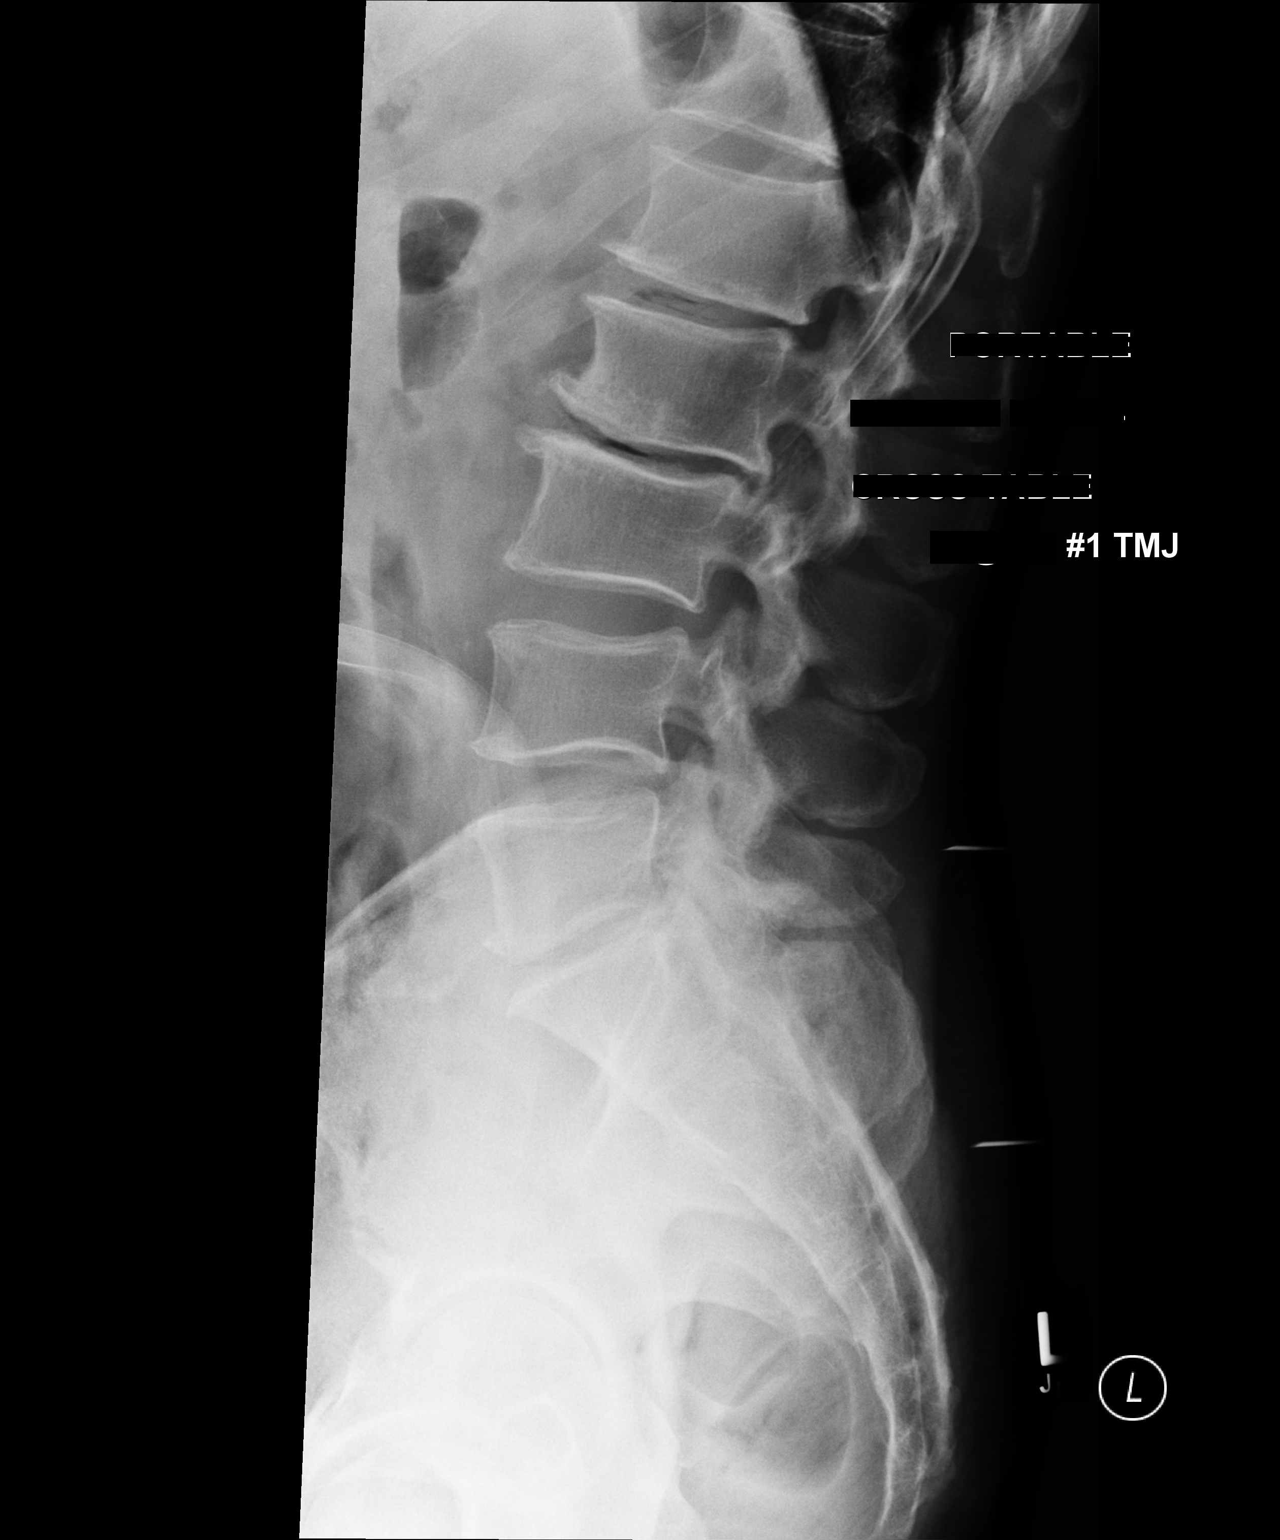

[lateral (2 of 2)]
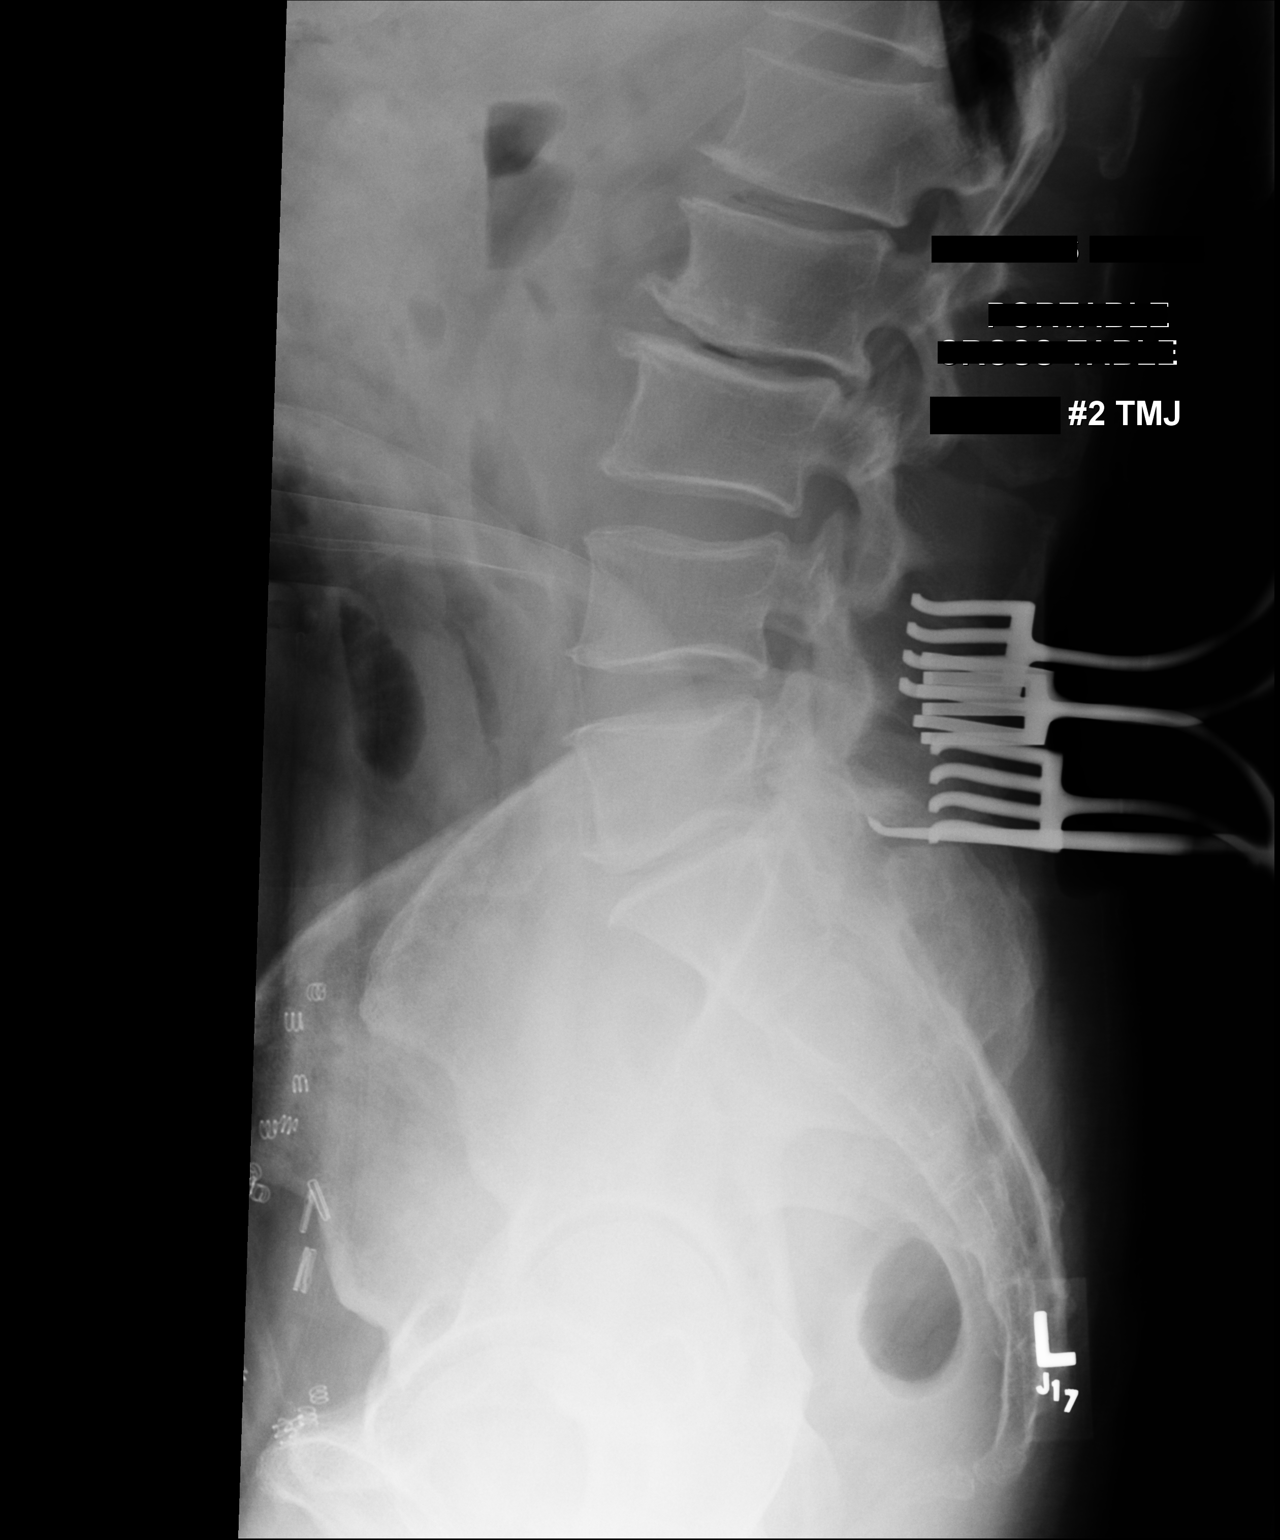

[2 of 2 positions shown; findings below may reference images not displayed]

FINDINGS: Presuming 5 lumbar type vertebral bodies, image 1 is shows a
localization probe at the level of the L5 spinous process with a
second more inferior probe at the level of the mid sacrum. Image 2
demonstrates surgical devices at L4 and L5 level with localization
probe at the level of the L5-S1 disc space.
IMPRESSION: Intraoperative localization

## 2015-06-13 DIAGNOSIS — M542 Cervicalgia: Secondary | ICD-10-CM | POA: Diagnosis not present

## 2015-06-13 DIAGNOSIS — M545 Low back pain: Secondary | ICD-10-CM | POA: Diagnosis not present

## 2015-07-22 DIAGNOSIS — H4011X0 Primary open-angle glaucoma, stage unspecified: Secondary | ICD-10-CM | POA: Diagnosis not present

## 2015-08-09 DIAGNOSIS — Z23 Encounter for immunization: Secondary | ICD-10-CM | POA: Diagnosis not present

## 2015-10-13 DIAGNOSIS — H401111 Primary open-angle glaucoma, right eye, mild stage: Secondary | ICD-10-CM | POA: Diagnosis not present

## 2015-10-13 DIAGNOSIS — H401122 Primary open-angle glaucoma, left eye, moderate stage: Secondary | ICD-10-CM | POA: Diagnosis not present

## 2015-12-26 DIAGNOSIS — H409 Unspecified glaucoma: Secondary | ICD-10-CM | POA: Diagnosis not present

## 2015-12-26 DIAGNOSIS — Z1389 Encounter for screening for other disorder: Secondary | ICD-10-CM | POA: Diagnosis not present

## 2015-12-26 DIAGNOSIS — N529 Male erectile dysfunction, unspecified: Secondary | ICD-10-CM | POA: Diagnosis not present

## 2015-12-26 DIAGNOSIS — Z0001 Encounter for general adult medical examination with abnormal findings: Secondary | ICD-10-CM | POA: Diagnosis not present

## 2015-12-26 DIAGNOSIS — I10 Essential (primary) hypertension: Secondary | ICD-10-CM | POA: Diagnosis not present

## 2015-12-26 DIAGNOSIS — Z8546 Personal history of malignant neoplasm of prostate: Secondary | ICD-10-CM | POA: Diagnosis not present

## 2015-12-26 DIAGNOSIS — E559 Vitamin D deficiency, unspecified: Secondary | ICD-10-CM | POA: Diagnosis not present

## 2015-12-26 DIAGNOSIS — Z79899 Other long term (current) drug therapy: Secondary | ICD-10-CM | POA: Diagnosis not present

## 2016-01-16 DIAGNOSIS — H401122 Primary open-angle glaucoma, left eye, moderate stage: Secondary | ICD-10-CM | POA: Diagnosis not present

## 2016-01-16 DIAGNOSIS — H401111 Primary open-angle glaucoma, right eye, mild stage: Secondary | ICD-10-CM | POA: Diagnosis not present

## 2016-01-18 ENCOUNTER — Other Ambulatory Visit: Payer: Self-pay | Admitting: Gastroenterology

## 2016-01-23 ENCOUNTER — Encounter (HOSPITAL_COMMUNITY): Payer: Self-pay | Admitting: *Deleted

## 2016-01-31 ENCOUNTER — Ambulatory Visit (HOSPITAL_COMMUNITY)
Admission: RE | Admit: 2016-01-31 | Discharge: 2016-01-31 | Disposition: A | Discharge status: Home / Self Care | Payer: Medicare Other | Source: Ambulatory Visit | Attending: Gastroenterology | Admitting: Gastroenterology

## 2016-01-31 ENCOUNTER — Encounter (HOSPITAL_COMMUNITY)
Admission: RE | Disposition: A | Discharge status: Home / Self Care | Payer: Self-pay | Source: Ambulatory Visit | Attending: Gastroenterology

## 2016-01-31 ENCOUNTER — Ambulatory Visit (HOSPITAL_COMMUNITY): Payer: Medicare Other | Admitting: Anesthesiology

## 2016-01-31 ENCOUNTER — Encounter (HOSPITAL_COMMUNITY): Payer: Self-pay | Admitting: *Deleted

## 2016-01-31 DIAGNOSIS — H409 Unspecified glaucoma: Secondary | ICD-10-CM | POA: Diagnosis not present

## 2016-01-31 DIAGNOSIS — Z87891 Personal history of nicotine dependence: Secondary | ICD-10-CM | POA: Insufficient documentation

## 2016-01-31 DIAGNOSIS — M1990 Unspecified osteoarthritis, unspecified site: Secondary | ICD-10-CM | POA: Insufficient documentation

## 2016-01-31 DIAGNOSIS — Z1211 Encounter for screening for malignant neoplasm of colon: Secondary | ICD-10-CM | POA: Diagnosis not present

## 2016-01-31 DIAGNOSIS — Z86718 Personal history of other venous thrombosis and embolism: Secondary | ICD-10-CM | POA: Diagnosis not present

## 2016-01-31 DIAGNOSIS — K635 Polyp of colon: Secondary | ICD-10-CM | POA: Diagnosis not present

## 2016-01-31 DIAGNOSIS — I10 Essential (primary) hypertension: Secondary | ICD-10-CM | POA: Insufficient documentation

## 2016-01-31 DIAGNOSIS — D123 Benign neoplasm of transverse colon: Secondary | ICD-10-CM | POA: Diagnosis not present

## 2016-01-31 HISTORY — PX: COLONOSCOPY WITH PROPOFOL: SHX5780

## 2016-01-31 SURGERY — COLONOSCOPY WITH PROPOFOL
Anesthesia: Monitor Anesthesia Care

## 2016-01-31 MED ORDER — PROPOFOL 10 MG/ML IV BOLUS
INTRAVENOUS | Status: DC | PRN
  Administered 2016-01-31 (×2): 20 mg via INTRAVENOUS
  Administered 2016-01-31: 40 mg via INTRAVENOUS
  Administered 2016-01-31: 20 mg via INTRAVENOUS
  Administered 2016-01-31 (×3): 40 mg via INTRAVENOUS

## 2016-01-31 MED ORDER — LACTATED RINGERS IV SOLN
INTRAVENOUS | Status: DC
Start: 2016-01-31 — End: 2016-01-31
  Administered 2016-01-31: 1000 mL via INTRAVENOUS

## 2016-01-31 MED ORDER — SODIUM CHLORIDE 0.9 % IV SOLN: INTRAVENOUS | Status: DC

## 2016-01-31 MED ORDER — PROPOFOL 10 MG/ML IV BOLUS
INTRAVENOUS | Status: AC
  Filled 2016-01-31: qty 20

## 2016-01-31 SURGICAL SUPPLY — 21 items

## 2016-01-31 NOTE — Op Note (Signed)
Procedure: Screening colonoscopy. Normal screening colonoscopy was performed on 02/01/2006  Endoscopist: Earle Gell  Premedication: Propofol administered by anesthesia  Procedure: The patient was placed in the left lateral decubitus position. Anal inspection and digital rectal exam were normal. The Pentax pediatric colonoscope was introduced into the rectum and advanced to the cecum. A normal-appearing ileocecal valve and appendiceal orifice were identified. Colonic preparation exam today was good. Withdrawal time was 12 minutes  Rectum. Normal. Retroflexed view of the distal rectum was normal  Sigmoid colon and descending colon. Normal  Splenic flexure. Normal  Transverse colon. A 3 mm sessile polyp was removed from the mid transverse colon with the cold biopsy forceps.  Hepatic flexure. Normal  Ascending colon. Normal  Cecum and ileocecal valve. Normal  Assessment: A diminutive polyp was removed from the transverse colon. Otherwise normal colonoscopy  Recommendation: If the polyp returns adenomatous pathologically, schedule a repeat colonoscopy in 5 years

## 2016-01-31 NOTE — Anesthesia Preprocedure Evaluation (Addendum)
Anesthesia Evaluation  Patient identified by MRN, date of birth, ID band Patient awake    Reviewed: Allergy & Precautions, NPO status , Patient's Chart, lab work & pertinent test results  History of Anesthesia Complications Negative for: history of anesthetic complications  Airway Mallampati: II  TM Distance: >3 FB Neck ROM: Full    Dental  (+) Teeth Intact, Dental Advisory Given   Pulmonary asthma , former smoker,    Pulmonary exam normal breath sounds clear to auscultation       Cardiovascular hypertension, Pt. on medications Normal cardiovascular exam Rhythm:Regular Rate:Normal  EKG 11/2014 no significant change, possible old MI   Neuro/Psych  Neuromuscular disease (LLE radiculopathy) negative psych ROS   GI/Hepatic negative GI ROS, Neg liver ROS,   Endo/Other  negative endocrine ROS  Renal/GU negative Renal ROS   Prostate cancer s/p resection     Musculoskeletal  (+) Arthritis , Osteoarthritis,    Abdominal   Peds  Hematology negative hematology ROS (+)   Anesthesia Other Findings Day of surgery medications reviewed with the patient.  Reproductive/Obstetrics                                                            Anesthesia Evaluation  Patient identified by MRN, date of birth, ID band Patient awake    Reviewed: Allergy & Precautions, NPO status , Patient's Chart, lab work & pertinent test results, reviewed documented beta blocker date and time   Airway Mallampati: II   Neck ROM: Full    Dental  (+) Teeth Intact, Dental Advisory Given   Pulmonary former smoker,  breath sounds clear to auscultation        Cardiovascular Exercise Tolerance: Good hypertension, Pt. on medications Rhythm:Regular  EKG 11/2014 no significant change, possible old MI   Neuro/Psych    GI/Hepatic negative GI ROS, Neg liver ROS,   Endo/Other    Renal/GU GFR 70      Musculoskeletal   Abdominal (+)  Abdomen: soft.    Peds  Hematology H/H 15/46   Anesthesia Other Findings   Reproductive/Obstetrics                            Anesthesia Physical Anesthesia Plan  ASA: II  Anesthesia Plan: General   Post-op Pain Management:    Induction: Intravenous  Airway Management Planned: Oral ETT  Additional Equipment:   Intra-op Plan:   Post-operative Plan: Extubation in OR  Informed Consent: I have reviewed the patients History and Physical, chart, labs and discussed the procedure including the risks, benefits and alternatives for the proposed anesthesia with the patient or authorized representative who has indicated his/her understanding and acceptance.     Plan Discussed with:   Anesthesia Plan Comments: (2nd IV after turning)        Anesthesia Quick Evaluation  Anesthesia Physical Anesthesia Plan  ASA: II  Anesthesia Plan: MAC   Post-op Pain Management:    Induction: Intravenous  Airway Management Planned: Nasal Cannula  Additional Equipment:   Intra-op Plan:   Post-operative Plan:   Informed Consent: I have reviewed the patients History and Physical, chart, labs and discussed the procedure including the risks, benefits and alternatives for the proposed anesthesia with the patient or authorized representative who has  indicated his/her understanding and acceptance.   Dental advisory given  Plan Discussed with: CRNA and Anesthesiologist  Anesthesia Plan Comments: (Discussed risks/benefits/alternatives to MAC sedation including need for ventilatory support, hypotension, need for conversion to general anesthesia.  All patient questions answered.  Patient wished to proceed.)        Anesthesia Quick Evaluation

## 2016-01-31 NOTE — Transfer of Care (Signed)
Immediate Anesthesia Transfer of Care Note  Patient: [name]  Procedure(s) Performed: Procedure(s): COLONOSCOPY WITH PROPOFOL (N/A)  Patient Location: PACU and Endoscopy Unit  Anesthesia Type:MAC  Level of Consciousness: awake, oriented, patient cooperative, lethargic and responds to stimulation  Airway & Oxygen Therapy: Patient Spontanous Breathing and Patient connected to face mask oxygen  Post-op Assessment: Report given to RN, Post -op Vital signs reviewed and stable and Patient moving all extremities  Post vital signs: Reviewed and stable  Last Vitals:  Filed Vitals:   01/31/16 0921  BP: 116/67  Temp: 36.7 C  Resp: 12    Complications: No apparent anesthesia complications

## 2016-01-31 NOTE — H&P (Signed)
  Procedure: Screening colonoscopy. Normal screening colonoscopy performed on 02/01/2006  History: The patient is a 65 year old male born [birth year]. He is scheduled to undergo a screening colonoscopy today.  Past medical history: Prostate cancer treated with robotic surgery. Hypertension. Glaucoma. Lumbar disc surgery. Bilateral inguinal herniorrhaphies. Deep venous thrombosis of the right or extremity.  Exam: The patient is alert and lying comfortably on the endoscopy stretcher. Abdomen is soft and nontender to palpation. Lungs are clear to auscultation. Cardiac exam reveals a regular rhythm.  Plan: Proceed with screening colonoscopy

## 2016-01-31 NOTE — Discharge Instructions (Signed)
Colonoscopy, Care After °Refer to this sheet in the next few weeks. These instructions provide you with information on caring for yourself after your procedure. Your health care provider may also give you more specific instructions. Your treatment has been planned according to current medical practices, but problems sometimes occur. Call your health care provider if you have any problems or questions after your procedure. °WHAT TO EXPECT AFTER THE PROCEDURE  °After your procedure, it is typical to have the following: °· A small amount of blood in your stool. °· Moderate amounts of gas and mild abdominal cramping or bloating. °HOME CARE INSTRUCTIONS °· Do not drive, operate machinery, or sign important documents for 24 hours. °· You may shower and resume your regular physical activities, but move at a slower pace for the first 24 hours. °· Take frequent rest periods for the first 24 hours. °· Walk around or put a warm pack on your abdomen to help reduce abdominal cramping and bloating. °· Drink enough fluids to keep your urine clear or pale yellow. °· You may resume your normal diet as instructed by your health care provider. Avoid heavy or fried foods that are hard to digest. °· Avoid drinking alcohol for 24 hours or as instructed by your health care provider. °· Only take over-the-counter or prescription medicines as directed by your health care provider. °· If a tissue sample (biopsy) was taken during your procedure: °¨ Do not take aspirin or blood thinners for 7 days, or as instructed by your health care provider. °¨ Do not drink alcohol for 7 days, or as instructed by your health care provider. °¨ Eat soft foods for the first 24 hours. °SEEK MEDICAL CARE IF: °You have persistent spotting of blood in your stool 2-3 days after the procedure. °SEEK IMMEDIATE MEDICAL CARE IF: °· You have more than a small spotting of blood in your stool. °· You pass large blood clots in your stool. °· Your abdomen is swollen  (distended). °· You have nausea or vomiting. °· You have a fever. °· You have increasing abdominal pain that is not relieved with medicine. °  °This information is not intended to replace advice given to you by your health care provider. Make sure you discuss any questions you have with your health care provider. °  °Document Released: 06/26/2004 Document Revised: 09/02/2013 Document Reviewed: 07/20/2013 °[employer] Interactive Patient Education ©2016 [employer] ° °

## 2016-01-31 NOTE — Anesthesia Postprocedure Evaluation (Signed)
Anesthesia Post Note  Patient: [name]  Procedure(s) Performed: Procedure(s) (LRB): COLONOSCOPY WITH PROPOFOL (N/A)  Patient location during evaluation: Endoscopy Anesthesia Type: MAC Level of consciousness: awake and alert Pain management: pain level controlled Vital Signs Assessment: post-procedure vital signs reviewed and stable Respiratory status: spontaneous breathing, nonlabored ventilation, respiratory function stable and patient connected to nasal cannula oxygen Cardiovascular status: stable and blood pressure returned to baseline Anesthetic complications: no    Last Vitals:  Filed Vitals:   01/31/16 0921 01/31/16 1119  BP: 116/67 99/60  Pulse:  65  Temp: 36.7 C 37 C  Resp: 12 17    Last Pain: There were no vitals filed for this visit.               Catalina Gravel

## 2016-02-01 ENCOUNTER — Encounter (HOSPITAL_COMMUNITY): Payer: Self-pay | Admitting: Gastroenterology

## 2016-04-10 DIAGNOSIS — C61 Malignant neoplasm of prostate: Secondary | ICD-10-CM | POA: Diagnosis not present

## 2016-04-16 DIAGNOSIS — H401122 Primary open-angle glaucoma, left eye, moderate stage: Secondary | ICD-10-CM | POA: Diagnosis not present

## 2016-04-16 DIAGNOSIS — H401111 Primary open-angle glaucoma, right eye, mild stage: Secondary | ICD-10-CM | POA: Diagnosis not present

## 2016-04-18 DIAGNOSIS — N5201 Erectile dysfunction due to arterial insufficiency: Secondary | ICD-10-CM | POA: Diagnosis not present

## 2016-04-18 DIAGNOSIS — Z0000 Encounter for general adult medical examination without abnormal findings: Secondary | ICD-10-CM | POA: Diagnosis not present

## 2016-04-18 DIAGNOSIS — C61 Malignant neoplasm of prostate: Secondary | ICD-10-CM | POA: Diagnosis not present

## 2016-07-17 DIAGNOSIS — H401111 Primary open-angle glaucoma, right eye, mild stage: Secondary | ICD-10-CM | POA: Diagnosis not present

## 2016-07-17 DIAGNOSIS — H401123 Primary open-angle glaucoma, left eye, severe stage: Secondary | ICD-10-CM | POA: Diagnosis not present

## 2016-08-21 DIAGNOSIS — Z23 Encounter for immunization: Secondary | ICD-10-CM | POA: Diagnosis not present

## 2016-10-17 DIAGNOSIS — H524 Presbyopia: Secondary | ICD-10-CM | POA: Diagnosis not present

## 2016-10-17 DIAGNOSIS — H43813 Vitreous degeneration, bilateral: Secondary | ICD-10-CM | POA: Diagnosis not present

## 2016-10-17 DIAGNOSIS — H5213 Myopia, bilateral: Secondary | ICD-10-CM | POA: Diagnosis not present

## 2016-10-17 DIAGNOSIS — H401131 Primary open-angle glaucoma, bilateral, mild stage: Secondary | ICD-10-CM | POA: Diagnosis not present

## 2016-10-17 DIAGNOSIS — H52223 Regular astigmatism, bilateral: Secondary | ICD-10-CM | POA: Diagnosis not present

## 2016-10-17 DIAGNOSIS — H2513 Age-related nuclear cataract, bilateral: Secondary | ICD-10-CM | POA: Diagnosis not present

## 2017-01-07 DIAGNOSIS — Z1389 Encounter for screening for other disorder: Secondary | ICD-10-CM | POA: Diagnosis not present

## 2017-01-07 DIAGNOSIS — E559 Vitamin D deficiency, unspecified: Secondary | ICD-10-CM | POA: Diagnosis not present

## 2017-01-07 DIAGNOSIS — Z125 Encounter for screening for malignant neoplasm of prostate: Secondary | ICD-10-CM | POA: Diagnosis not present

## 2017-01-07 DIAGNOSIS — Z0000 Encounter for general adult medical examination without abnormal findings: Secondary | ICD-10-CM | POA: Diagnosis not present

## 2017-01-07 DIAGNOSIS — I10 Essential (primary) hypertension: Secondary | ICD-10-CM | POA: Diagnosis not present

## 2017-01-07 DIAGNOSIS — Z8546 Personal history of malignant neoplasm of prostate: Secondary | ICD-10-CM | POA: Diagnosis not present

## 2017-01-07 DIAGNOSIS — N529 Male erectile dysfunction, unspecified: Secondary | ICD-10-CM | POA: Diagnosis not present

## 2017-01-07 DIAGNOSIS — H409 Unspecified glaucoma: Secondary | ICD-10-CM | POA: Diagnosis not present

## 2017-01-07 DIAGNOSIS — Z79899 Other long term (current) drug therapy: Secondary | ICD-10-CM | POA: Diagnosis not present

## 2017-01-17 DIAGNOSIS — H401131 Primary open-angle glaucoma, bilateral, mild stage: Secondary | ICD-10-CM | POA: Diagnosis not present

## 2017-03-28 DIAGNOSIS — C61 Malignant neoplasm of prostate: Secondary | ICD-10-CM | POA: Diagnosis not present

## 2017-04-05 DIAGNOSIS — N5201 Erectile dysfunction due to arterial insufficiency: Secondary | ICD-10-CM | POA: Diagnosis not present

## 2017-04-05 DIAGNOSIS — C61 Malignant neoplasm of prostate: Secondary | ICD-10-CM | POA: Diagnosis not present

## 2017-05-06 DIAGNOSIS — H401111 Primary open-angle glaucoma, right eye, mild stage: Secondary | ICD-10-CM | POA: Diagnosis not present

## 2017-08-07 DIAGNOSIS — H401111 Primary open-angle glaucoma, right eye, mild stage: Secondary | ICD-10-CM | POA: Diagnosis not present

## 2017-09-06 DIAGNOSIS — Z23 Encounter for immunization: Secondary | ICD-10-CM | POA: Diagnosis not present

## 2017-11-05 DIAGNOSIS — H401111 Primary open-angle glaucoma, right eye, mild stage: Secondary | ICD-10-CM | POA: Diagnosis not present

## 2018-01-13 DIAGNOSIS — Z1389 Encounter for screening for other disorder: Secondary | ICD-10-CM | POA: Diagnosis not present

## 2018-01-13 DIAGNOSIS — Z79899 Other long term (current) drug therapy: Secondary | ICD-10-CM | POA: Diagnosis not present

## 2018-01-13 DIAGNOSIS — Z8546 Personal history of malignant neoplasm of prostate: Secondary | ICD-10-CM | POA: Diagnosis not present

## 2018-01-13 DIAGNOSIS — N529 Male erectile dysfunction, unspecified: Secondary | ICD-10-CM | POA: Diagnosis not present

## 2018-01-13 DIAGNOSIS — H409 Unspecified glaucoma: Secondary | ICD-10-CM | POA: Diagnosis not present

## 2018-01-13 DIAGNOSIS — H9193 Unspecified hearing loss, bilateral: Secondary | ICD-10-CM | POA: Diagnosis not present

## 2018-01-13 DIAGNOSIS — E559 Vitamin D deficiency, unspecified: Secondary | ICD-10-CM | POA: Diagnosis not present

## 2018-01-13 DIAGNOSIS — I10 Essential (primary) hypertension: Secondary | ICD-10-CM | POA: Diagnosis not present

## 2018-01-13 DIAGNOSIS — Z0000 Encounter for general adult medical examination without abnormal findings: Secondary | ICD-10-CM | POA: Diagnosis not present

## 2018-01-13 DIAGNOSIS — H9313 Tinnitus, bilateral: Secondary | ICD-10-CM | POA: Diagnosis not present

## 2018-02-03 DIAGNOSIS — H401111 Primary open-angle glaucoma, right eye, mild stage: Secondary | ICD-10-CM | POA: Diagnosis not present

## 2018-02-05 DIAGNOSIS — C61 Malignant neoplasm of prostate: Secondary | ICD-10-CM | POA: Diagnosis not present

## 2018-02-05 DIAGNOSIS — N5201 Erectile dysfunction due to arterial insufficiency: Secondary | ICD-10-CM | POA: Diagnosis not present

## 2018-03-10 DIAGNOSIS — M542 Cervicalgia: Secondary | ICD-10-CM | POA: Diagnosis not present

## 2018-03-10 DIAGNOSIS — M5412 Radiculopathy, cervical region: Secondary | ICD-10-CM | POA: Diagnosis not present

## 2018-05-06 DIAGNOSIS — H401111 Primary open-angle glaucoma, right eye, mild stage: Secondary | ICD-10-CM | POA: Diagnosis not present

## 2018-06-09 DIAGNOSIS — H401111 Primary open-angle glaucoma, right eye, mild stage: Secondary | ICD-10-CM | POA: Diagnosis not present

## 2018-06-09 DIAGNOSIS — H401122 Primary open-angle glaucoma, left eye, moderate stage: Secondary | ICD-10-CM | POA: Diagnosis not present

## 2018-08-20 DIAGNOSIS — Z23 Encounter for immunization: Secondary | ICD-10-CM | POA: Diagnosis not present

## 2018-09-09 DIAGNOSIS — H401111 Primary open-angle glaucoma, right eye, mild stage: Secondary | ICD-10-CM | POA: Diagnosis not present

## 2018-12-04 DIAGNOSIS — H401131 Primary open-angle glaucoma, bilateral, mild stage: Secondary | ICD-10-CM | POA: Diagnosis not present

## 2019-01-20 DIAGNOSIS — M542 Cervicalgia: Secondary | ICD-10-CM | POA: Diagnosis not present

## 2019-01-20 DIAGNOSIS — H9193 Unspecified hearing loss, bilateral: Secondary | ICD-10-CM | POA: Diagnosis not present

## 2019-01-20 DIAGNOSIS — E559 Vitamin D deficiency, unspecified: Secondary | ICD-10-CM | POA: Diagnosis not present

## 2019-01-20 DIAGNOSIS — H9313 Tinnitus, bilateral: Secondary | ICD-10-CM | POA: Diagnosis not present

## 2019-01-20 DIAGNOSIS — Z79899 Other long term (current) drug therapy: Secondary | ICD-10-CM | POA: Diagnosis not present

## 2019-01-20 DIAGNOSIS — I10 Essential (primary) hypertension: Secondary | ICD-10-CM | POA: Diagnosis not present

## 2019-01-20 DIAGNOSIS — H409 Unspecified glaucoma: Secondary | ICD-10-CM | POA: Diagnosis not present

## 2019-01-20 DIAGNOSIS — N529 Male erectile dysfunction, unspecified: Secondary | ICD-10-CM | POA: Diagnosis not present

## 2019-01-20 DIAGNOSIS — Z125 Encounter for screening for malignant neoplasm of prostate: Secondary | ICD-10-CM | POA: Diagnosis not present

## 2019-01-20 DIAGNOSIS — Z0001 Encounter for general adult medical examination with abnormal findings: Secondary | ICD-10-CM | POA: Diagnosis not present

## 2019-01-20 DIAGNOSIS — Z1389 Encounter for screening for other disorder: Secondary | ICD-10-CM | POA: Diagnosis not present

## 2019-03-31 DIAGNOSIS — H401111 Primary open-angle glaucoma, right eye, mild stage: Secondary | ICD-10-CM | POA: Diagnosis not present

## 2019-07-22 DIAGNOSIS — H401111 Primary open-angle glaucoma, right eye, mild stage: Secondary | ICD-10-CM | POA: Diagnosis not present

## 2019-09-02 DIAGNOSIS — Z23 Encounter for immunization: Secondary | ICD-10-CM | POA: Diagnosis not present

## 2019-09-08 DIAGNOSIS — Z20828 Contact with and (suspected) exposure to other viral communicable diseases: Secondary | ICD-10-CM | POA: Diagnosis not present

## 2019-10-21 DIAGNOSIS — H401112 Primary open-angle glaucoma, right eye, moderate stage: Secondary | ICD-10-CM | POA: Diagnosis not present

## 2020-02-08 DIAGNOSIS — Z0001 Encounter for general adult medical examination with abnormal findings: Secondary | ICD-10-CM | POA: Diagnosis not present

## 2020-02-08 DIAGNOSIS — E559 Vitamin D deficiency, unspecified: Secondary | ICD-10-CM | POA: Diagnosis not present

## 2020-02-08 DIAGNOSIS — Z79899 Other long term (current) drug therapy: Secondary | ICD-10-CM | POA: Diagnosis not present

## 2020-02-08 DIAGNOSIS — Z8546 Personal history of malignant neoplasm of prostate: Secondary | ICD-10-CM | POA: Diagnosis not present

## 2020-02-08 DIAGNOSIS — H9313 Tinnitus, bilateral: Secondary | ICD-10-CM | POA: Diagnosis not present

## 2020-02-08 DIAGNOSIS — H409 Unspecified glaucoma: Secondary | ICD-10-CM | POA: Diagnosis not present

## 2020-02-08 DIAGNOSIS — N529 Male erectile dysfunction, unspecified: Secondary | ICD-10-CM | POA: Diagnosis not present

## 2020-02-08 DIAGNOSIS — H9193 Unspecified hearing loss, bilateral: Secondary | ICD-10-CM | POA: Diagnosis not present

## 2020-02-08 DIAGNOSIS — Z1389 Encounter for screening for other disorder: Secondary | ICD-10-CM | POA: Diagnosis not present

## 2020-02-08 DIAGNOSIS — I10 Essential (primary) hypertension: Secondary | ICD-10-CM | POA: Diagnosis not present

## 2020-02-08 DIAGNOSIS — Z125 Encounter for screening for malignant neoplasm of prostate: Secondary | ICD-10-CM | POA: Diagnosis not present

## 2020-02-08 DIAGNOSIS — M542 Cervicalgia: Secondary | ICD-10-CM | POA: Diagnosis not present

## 2020-02-15 DIAGNOSIS — H401111 Primary open-angle glaucoma, right eye, mild stage: Secondary | ICD-10-CM | POA: Diagnosis not present

## 2020-03-07 DIAGNOSIS — H43813 Vitreous degeneration, bilateral: Secondary | ICD-10-CM | POA: Diagnosis not present

## 2020-03-07 DIAGNOSIS — H2513 Age-related nuclear cataract, bilateral: Secondary | ICD-10-CM | POA: Diagnosis not present

## 2020-03-07 DIAGNOSIS — H401111 Primary open-angle glaucoma, right eye, mild stage: Secondary | ICD-10-CM | POA: Diagnosis not present

## 2020-03-07 DIAGNOSIS — H401123 Primary open-angle glaucoma, left eye, severe stage: Secondary | ICD-10-CM | POA: Diagnosis not present

## 2020-03-07 DIAGNOSIS — H35371 Puckering of macula, right eye: Secondary | ICD-10-CM | POA: Diagnosis not present

## 2020-03-15 DIAGNOSIS — H35371 Puckering of macula, right eye: Secondary | ICD-10-CM | POA: Diagnosis not present

## 2020-03-15 DIAGNOSIS — H2513 Age-related nuclear cataract, bilateral: Secondary | ICD-10-CM | POA: Diagnosis not present

## 2020-03-15 DIAGNOSIS — H401111 Primary open-angle glaucoma, right eye, mild stage: Secondary | ICD-10-CM | POA: Diagnosis not present

## 2020-03-15 DIAGNOSIS — H43813 Vitreous degeneration, bilateral: Secondary | ICD-10-CM | POA: Diagnosis not present

## 2020-03-15 DIAGNOSIS — H401123 Primary open-angle glaucoma, left eye, severe stage: Secondary | ICD-10-CM | POA: Diagnosis not present

## 2020-04-14 DIAGNOSIS — H2512 Age-related nuclear cataract, left eye: Secondary | ICD-10-CM | POA: Diagnosis not present

## 2020-04-14 DIAGNOSIS — H401123 Primary open-angle glaucoma, left eye, severe stage: Secondary | ICD-10-CM | POA: Diagnosis not present

## 2020-06-13 DIAGNOSIS — H2511 Age-related nuclear cataract, right eye: Secondary | ICD-10-CM | POA: Diagnosis not present

## 2020-06-16 DIAGNOSIS — H401111 Primary open-angle glaucoma, right eye, mild stage: Secondary | ICD-10-CM | POA: Diagnosis not present

## 2020-06-16 DIAGNOSIS — H2511 Age-related nuclear cataract, right eye: Secondary | ICD-10-CM | POA: Diagnosis not present

## 2020-08-18 DIAGNOSIS — Z23 Encounter for immunization: Secondary | ICD-10-CM | POA: Diagnosis not present

## 2020-09-28 DIAGNOSIS — Z23 Encounter for immunization: Secondary | ICD-10-CM | POA: Diagnosis not present

## 2020-10-03 DIAGNOSIS — H401112 Primary open-angle glaucoma, right eye, moderate stage: Secondary | ICD-10-CM | POA: Diagnosis not present

## 2021-01-10 DIAGNOSIS — H401112 Primary open-angle glaucoma, right eye, moderate stage: Secondary | ICD-10-CM | POA: Diagnosis not present

## 2021-01-27 DIAGNOSIS — H35371 Puckering of macula, right eye: Secondary | ICD-10-CM | POA: Diagnosis not present

## 2021-01-27 DIAGNOSIS — H401123 Primary open-angle glaucoma, left eye, severe stage: Secondary | ICD-10-CM | POA: Diagnosis not present

## 2021-01-27 DIAGNOSIS — H43813 Vitreous degeneration, bilateral: Secondary | ICD-10-CM | POA: Diagnosis not present

## 2021-01-27 DIAGNOSIS — Z961 Presence of intraocular lens: Secondary | ICD-10-CM | POA: Diagnosis not present

## 2021-01-27 DIAGNOSIS — H401111 Primary open-angle glaucoma, right eye, mild stage: Secondary | ICD-10-CM | POA: Diagnosis not present

## 2021-02-20 DIAGNOSIS — Z79899 Other long term (current) drug therapy: Secondary | ICD-10-CM | POA: Diagnosis not present

## 2021-02-20 DIAGNOSIS — H409 Unspecified glaucoma: Secondary | ICD-10-CM | POA: Diagnosis not present

## 2021-02-20 DIAGNOSIS — E559 Vitamin D deficiency, unspecified: Secondary | ICD-10-CM | POA: Diagnosis not present

## 2021-02-20 DIAGNOSIS — H9193 Unspecified hearing loss, bilateral: Secondary | ICD-10-CM | POA: Diagnosis not present

## 2021-02-20 DIAGNOSIS — Z0001 Encounter for general adult medical examination with abnormal findings: Secondary | ICD-10-CM | POA: Diagnosis not present

## 2021-02-20 DIAGNOSIS — H9313 Tinnitus, bilateral: Secondary | ICD-10-CM | POA: Diagnosis not present

## 2021-02-20 DIAGNOSIS — Z8546 Personal history of malignant neoplasm of prostate: Secondary | ICD-10-CM | POA: Diagnosis not present

## 2021-02-20 DIAGNOSIS — Z1389 Encounter for screening for other disorder: Secondary | ICD-10-CM | POA: Diagnosis not present

## 2021-02-20 DIAGNOSIS — I10 Essential (primary) hypertension: Secondary | ICD-10-CM | POA: Diagnosis not present

## 2021-02-20 DIAGNOSIS — M542 Cervicalgia: Secondary | ICD-10-CM | POA: Diagnosis not present

## 2021-02-20 DIAGNOSIS — N529 Male erectile dysfunction, unspecified: Secondary | ICD-10-CM | POA: Diagnosis not present

## 2021-03-14 DIAGNOSIS — Z961 Presence of intraocular lens: Secondary | ICD-10-CM | POA: Diagnosis not present

## 2021-03-14 DIAGNOSIS — H35371 Puckering of macula, right eye: Secondary | ICD-10-CM | POA: Diagnosis not present

## 2021-03-14 DIAGNOSIS — H401111 Primary open-angle glaucoma, right eye, mild stage: Secondary | ICD-10-CM | POA: Diagnosis not present

## 2021-03-14 DIAGNOSIS — H43813 Vitreous degeneration, bilateral: Secondary | ICD-10-CM | POA: Diagnosis not present

## 2021-03-14 DIAGNOSIS — H401123 Primary open-angle glaucoma, left eye, severe stage: Secondary | ICD-10-CM | POA: Diagnosis not present

## 2021-07-14 DIAGNOSIS — H401111 Primary open-angle glaucoma, right eye, mild stage: Secondary | ICD-10-CM | POA: Diagnosis not present

## 2021-07-14 DIAGNOSIS — H401123 Primary open-angle glaucoma, left eye, severe stage: Secondary | ICD-10-CM | POA: Diagnosis not present

## 2021-08-24 DIAGNOSIS — Z23 Encounter for immunization: Secondary | ICD-10-CM | POA: Diagnosis not present

## 2021-08-28 DIAGNOSIS — K5730 Diverticulosis of large intestine without perforation or abscess without bleeding: Secondary | ICD-10-CM | POA: Diagnosis not present

## 2021-08-28 DIAGNOSIS — K648 Other hemorrhoids: Secondary | ICD-10-CM | POA: Diagnosis not present

## 2021-08-28 DIAGNOSIS — K644 Residual hemorrhoidal skin tags: Secondary | ICD-10-CM | POA: Diagnosis not present

## 2021-08-28 DIAGNOSIS — K635 Polyp of colon: Secondary | ICD-10-CM | POA: Diagnosis not present

## 2021-08-28 DIAGNOSIS — Z86010 Personal history of colonic polyps: Secondary | ICD-10-CM | POA: Diagnosis not present

## 2021-08-30 DIAGNOSIS — K635 Polyp of colon: Secondary | ICD-10-CM | POA: Diagnosis not present

## 2021-11-15 DIAGNOSIS — Z961 Presence of intraocular lens: Secondary | ICD-10-CM | POA: Diagnosis not present

## 2021-11-15 DIAGNOSIS — H401112 Primary open-angle glaucoma, right eye, moderate stage: Secondary | ICD-10-CM | POA: Diagnosis not present

## 2021-11-15 DIAGNOSIS — H35373 Puckering of macula, bilateral: Secondary | ICD-10-CM | POA: Diagnosis not present

## 2021-11-15 DIAGNOSIS — H401123 Primary open-angle glaucoma, left eye, severe stage: Secondary | ICD-10-CM | POA: Diagnosis not present

## 2021-11-30 DIAGNOSIS — H401123 Primary open-angle glaucoma, left eye, severe stage: Secondary | ICD-10-CM | POA: Diagnosis not present

## 2021-11-30 DIAGNOSIS — H401112 Primary open-angle glaucoma, right eye, moderate stage: Secondary | ICD-10-CM | POA: Diagnosis not present

## 2021-12-20 DIAGNOSIS — H35373 Puckering of macula, bilateral: Secondary | ICD-10-CM | POA: Diagnosis not present

## 2021-12-20 DIAGNOSIS — H401123 Primary open-angle glaucoma, left eye, severe stage: Secondary | ICD-10-CM | POA: Diagnosis not present

## 2021-12-20 DIAGNOSIS — H401112 Primary open-angle glaucoma, right eye, moderate stage: Secondary | ICD-10-CM | POA: Diagnosis not present

## 2021-12-20 DIAGNOSIS — H43813 Vitreous degeneration, bilateral: Secondary | ICD-10-CM | POA: Diagnosis not present

## 2022-01-17 DIAGNOSIS — Z981 Arthrodesis status: Secondary | ICD-10-CM | POA: Diagnosis not present

## 2022-01-17 DIAGNOSIS — Z8546 Personal history of malignant neoplasm of prostate: Secondary | ICD-10-CM | POA: Diagnosis not present

## 2022-01-17 DIAGNOSIS — H401123 Primary open-angle glaucoma, left eye, severe stage: Secondary | ICD-10-CM | POA: Diagnosis not present

## 2022-01-17 DIAGNOSIS — Z9889 Other specified postprocedural states: Secondary | ICD-10-CM | POA: Diagnosis not present

## 2022-01-17 DIAGNOSIS — M159 Polyosteoarthritis, unspecified: Secondary | ICD-10-CM | POA: Diagnosis not present

## 2022-01-17 DIAGNOSIS — Z86010 Personal history of colonic polyps: Secondary | ICD-10-CM | POA: Diagnosis not present

## 2022-01-17 DIAGNOSIS — I10 Essential (primary) hypertension: Secondary | ICD-10-CM | POA: Diagnosis not present

## 2022-01-17 DIAGNOSIS — Z9079 Acquired absence of other genital organ(s): Secondary | ICD-10-CM | POA: Diagnosis not present

## 2022-01-17 DIAGNOSIS — Z79899 Other long term (current) drug therapy: Secondary | ICD-10-CM | POA: Diagnosis not present

## 2022-03-13 DIAGNOSIS — Z8546 Personal history of malignant neoplasm of prostate: Secondary | ICD-10-CM | POA: Diagnosis not present

## 2022-03-13 DIAGNOSIS — H9313 Tinnitus, bilateral: Secondary | ICD-10-CM | POA: Diagnosis not present

## 2022-03-13 DIAGNOSIS — Z1389 Encounter for screening for other disorder: Secondary | ICD-10-CM | POA: Diagnosis not present

## 2022-03-13 DIAGNOSIS — Z79899 Other long term (current) drug therapy: Secondary | ICD-10-CM | POA: Diagnosis not present

## 2022-03-13 DIAGNOSIS — I10 Essential (primary) hypertension: Secondary | ICD-10-CM | POA: Diagnosis not present

## 2022-03-13 DIAGNOSIS — H409 Unspecified glaucoma: Secondary | ICD-10-CM | POA: Diagnosis not present

## 2022-03-13 DIAGNOSIS — H5462 Unqualified visual loss, left eye, normal vision right eye: Secondary | ICD-10-CM | POA: Diagnosis not present

## 2022-03-13 DIAGNOSIS — N529 Male erectile dysfunction, unspecified: Secondary | ICD-10-CM | POA: Diagnosis not present

## 2022-03-13 DIAGNOSIS — E559 Vitamin D deficiency, unspecified: Secondary | ICD-10-CM | POA: Diagnosis not present

## 2022-03-13 DIAGNOSIS — Z0001 Encounter for general adult medical examination with abnormal findings: Secondary | ICD-10-CM | POA: Diagnosis not present

## 2022-03-13 DIAGNOSIS — H9193 Unspecified hearing loss, bilateral: Secondary | ICD-10-CM | POA: Diagnosis not present

## 2022-03-13 DIAGNOSIS — M542 Cervicalgia: Secondary | ICD-10-CM | POA: Diagnosis not present

## 2022-04-19 DIAGNOSIS — H401123 Primary open-angle glaucoma, left eye, severe stage: Secondary | ICD-10-CM | POA: Diagnosis not present

## 2022-04-19 DIAGNOSIS — H401112 Primary open-angle glaucoma, right eye, moderate stage: Secondary | ICD-10-CM | POA: Diagnosis not present

## 2022-04-27 DIAGNOSIS — Z79899 Other long term (current) drug therapy: Secondary | ICD-10-CM | POA: Diagnosis not present

## 2022-05-24 DIAGNOSIS — H401112 Primary open-angle glaucoma, right eye, moderate stage: Secondary | ICD-10-CM | POA: Diagnosis not present

## 2022-05-24 DIAGNOSIS — H401123 Primary open-angle glaucoma, left eye, severe stage: Secondary | ICD-10-CM | POA: Diagnosis not present

## 2022-05-26 DIAGNOSIS — H401112 Primary open-angle glaucoma, right eye, moderate stage: Secondary | ICD-10-CM | POA: Diagnosis not present

## 2022-08-02 DIAGNOSIS — H401112 Primary open-angle glaucoma, right eye, moderate stage: Secondary | ICD-10-CM | POA: Diagnosis not present

## 2022-08-02 DIAGNOSIS — H401123 Primary open-angle glaucoma, left eye, severe stage: Secondary | ICD-10-CM | POA: Diagnosis not present

## 2022-09-14 DIAGNOSIS — Z23 Encounter for immunization: Secondary | ICD-10-CM | POA: Diagnosis not present

## 2022-11-01 DIAGNOSIS — H401112 Primary open-angle glaucoma, right eye, moderate stage: Secondary | ICD-10-CM | POA: Diagnosis not present

## 2022-12-05 DIAGNOSIS — H401113 Primary open-angle glaucoma, right eye, severe stage: Secondary | ICD-10-CM | POA: Diagnosis not present

## 2022-12-05 DIAGNOSIS — Z79899 Other long term (current) drug therapy: Secondary | ICD-10-CM | POA: Diagnosis not present

## 2022-12-05 DIAGNOSIS — Z7982 Long term (current) use of aspirin: Secondary | ICD-10-CM | POA: Diagnosis not present

## 2022-12-05 DIAGNOSIS — I10 Essential (primary) hypertension: Secondary | ICD-10-CM | POA: Diagnosis not present

## 2022-12-05 DIAGNOSIS — J45909 Unspecified asthma, uncomplicated: Secondary | ICD-10-CM | POA: Diagnosis not present

## 2022-12-05 DIAGNOSIS — M19042 Primary osteoarthritis, left hand: Secondary | ICD-10-CM | POA: Diagnosis not present

## 2022-12-05 DIAGNOSIS — M19041 Primary osteoarthritis, right hand: Secondary | ICD-10-CM | POA: Diagnosis not present

## 2023-01-14 DIAGNOSIS — H401112 Primary open-angle glaucoma, right eye, moderate stage: Secondary | ICD-10-CM | POA: Diagnosis not present

## 2023-01-14 DIAGNOSIS — H401123 Primary open-angle glaucoma, left eye, severe stage: Secondary | ICD-10-CM | POA: Diagnosis not present

## 2023-02-07 DIAGNOSIS — H401123 Primary open-angle glaucoma, left eye, severe stage: Secondary | ICD-10-CM | POA: Diagnosis not present

## 2023-02-07 DIAGNOSIS — H401112 Primary open-angle glaucoma, right eye, moderate stage: Secondary | ICD-10-CM | POA: Diagnosis not present

## 2023-03-07 DIAGNOSIS — H401112 Primary open-angle glaucoma, right eye, moderate stage: Secondary | ICD-10-CM | POA: Diagnosis not present

## 2023-03-07 DIAGNOSIS — H401123 Primary open-angle glaucoma, left eye, severe stage: Secondary | ICD-10-CM | POA: Diagnosis not present

## 2023-03-20 DIAGNOSIS — I10 Essential (primary) hypertension: Secondary | ICD-10-CM | POA: Diagnosis not present

## 2023-03-20 DIAGNOSIS — Z23 Encounter for immunization: Secondary | ICD-10-CM | POA: Diagnosis not present

## 2023-03-20 DIAGNOSIS — Z0000 Encounter for general adult medical examination without abnormal findings: Secondary | ICD-10-CM | POA: Diagnosis not present

## 2023-03-20 DIAGNOSIS — E559 Vitamin D deficiency, unspecified: Secondary | ICD-10-CM | POA: Diagnosis not present

## 2023-03-20 DIAGNOSIS — Z8546 Personal history of malignant neoplasm of prostate: Secondary | ICD-10-CM | POA: Diagnosis not present

## 2023-03-20 DIAGNOSIS — E782 Mixed hyperlipidemia: Secondary | ICD-10-CM | POA: Diagnosis not present

## 2023-04-16 DIAGNOSIS — M5441 Lumbago with sciatica, right side: Secondary | ICD-10-CM | POA: Diagnosis not present

## 2023-04-16 DIAGNOSIS — M545 Low back pain, unspecified: Secondary | ICD-10-CM | POA: Diagnosis not present

## 2023-04-19 DIAGNOSIS — M5416 Radiculopathy, lumbar region: Secondary | ICD-10-CM | POA: Diagnosis not present

## 2023-04-25 DIAGNOSIS — M5416 Radiculopathy, lumbar region: Secondary | ICD-10-CM | POA: Diagnosis not present

## 2023-04-29 DIAGNOSIS — M5416 Radiculopathy, lumbar region: Secondary | ICD-10-CM | POA: Diagnosis not present

## 2023-05-02 DIAGNOSIS — M5416 Radiculopathy, lumbar region: Secondary | ICD-10-CM | POA: Diagnosis not present

## 2023-05-07 DIAGNOSIS — M5416 Radiculopathy, lumbar region: Secondary | ICD-10-CM | POA: Diagnosis not present

## 2023-05-09 DIAGNOSIS — M5416 Radiculopathy, lumbar region: Secondary | ICD-10-CM | POA: Diagnosis not present

## 2023-05-10 DIAGNOSIS — H43813 Vitreous degeneration, bilateral: Secondary | ICD-10-CM | POA: Diagnosis not present

## 2023-05-10 DIAGNOSIS — H35373 Puckering of macula, bilateral: Secondary | ICD-10-CM | POA: Diagnosis not present

## 2023-05-10 DIAGNOSIS — H401123 Primary open-angle glaucoma, left eye, severe stage: Secondary | ICD-10-CM | POA: Diagnosis not present

## 2023-05-10 DIAGNOSIS — H401112 Primary open-angle glaucoma, right eye, moderate stage: Secondary | ICD-10-CM | POA: Diagnosis not present

## 2023-05-10 DIAGNOSIS — Z961 Presence of intraocular lens: Secondary | ICD-10-CM | POA: Diagnosis not present

## 2023-05-15 DIAGNOSIS — M5416 Radiculopathy, lumbar region: Secondary | ICD-10-CM | POA: Diagnosis not present

## 2023-05-16 DIAGNOSIS — M5416 Radiculopathy, lumbar region: Secondary | ICD-10-CM | POA: Diagnosis not present

## 2023-05-22 DIAGNOSIS — M5416 Radiculopathy, lumbar region: Secondary | ICD-10-CM | POA: Diagnosis not present

## 2023-05-24 DIAGNOSIS — M5416 Radiculopathy, lumbar region: Secondary | ICD-10-CM | POA: Diagnosis not present

## 2023-05-31 DIAGNOSIS — M5416 Radiculopathy, lumbar region: Secondary | ICD-10-CM | POA: Diagnosis not present

## 2023-06-14 DIAGNOSIS — H35373 Puckering of macula, bilateral: Secondary | ICD-10-CM | POA: Diagnosis not present

## 2023-06-14 DIAGNOSIS — Z961 Presence of intraocular lens: Secondary | ICD-10-CM | POA: Diagnosis not present

## 2023-06-14 DIAGNOSIS — H401123 Primary open-angle glaucoma, left eye, severe stage: Secondary | ICD-10-CM | POA: Diagnosis not present

## 2023-06-14 DIAGNOSIS — H401112 Primary open-angle glaucoma, right eye, moderate stage: Secondary | ICD-10-CM | POA: Diagnosis not present

## 2023-08-26 DIAGNOSIS — H401123 Primary open-angle glaucoma, left eye, severe stage: Secondary | ICD-10-CM | POA: Diagnosis not present

## 2023-08-26 DIAGNOSIS — Z961 Presence of intraocular lens: Secondary | ICD-10-CM | POA: Diagnosis not present

## 2023-08-26 DIAGNOSIS — H401112 Primary open-angle glaucoma, right eye, moderate stage: Secondary | ICD-10-CM | POA: Diagnosis not present

## 2023-08-26 DIAGNOSIS — H43813 Vitreous degeneration, bilateral: Secondary | ICD-10-CM | POA: Diagnosis not present

## 2023-08-26 DIAGNOSIS — H35373 Puckering of macula, bilateral: Secondary | ICD-10-CM | POA: Diagnosis not present

## 2023-09-02 DIAGNOSIS — Z23 Encounter for immunization: Secondary | ICD-10-CM | POA: Diagnosis not present

## 2024-01-01 DIAGNOSIS — H401112 Primary open-angle glaucoma, right eye, moderate stage: Secondary | ICD-10-CM | POA: Diagnosis not present

## 2024-01-01 DIAGNOSIS — H35373 Puckering of macula, bilateral: Secondary | ICD-10-CM | POA: Diagnosis not present

## 2024-01-01 DIAGNOSIS — Z961 Presence of intraocular lens: Secondary | ICD-10-CM | POA: Diagnosis not present

## 2024-01-01 DIAGNOSIS — H43813 Vitreous degeneration, bilateral: Secondary | ICD-10-CM | POA: Diagnosis not present

## 2024-01-01 DIAGNOSIS — H401123 Primary open-angle glaucoma, left eye, severe stage: Secondary | ICD-10-CM | POA: Diagnosis not present

## 2024-02-01 DIAGNOSIS — I10 Essential (primary) hypertension: Secondary | ICD-10-CM | POA: Diagnosis not present

## 2024-02-11 DIAGNOSIS — H35373 Puckering of macula, bilateral: Secondary | ICD-10-CM | POA: Diagnosis not present

## 2024-02-11 DIAGNOSIS — H401112 Primary open-angle glaucoma, right eye, moderate stage: Secondary | ICD-10-CM | POA: Diagnosis not present

## 2024-02-11 DIAGNOSIS — H401123 Primary open-angle glaucoma, left eye, severe stage: Secondary | ICD-10-CM | POA: Diagnosis not present

## 2024-02-11 DIAGNOSIS — Z961 Presence of intraocular lens: Secondary | ICD-10-CM | POA: Diagnosis not present

## 2024-02-11 DIAGNOSIS — H43813 Vitreous degeneration, bilateral: Secondary | ICD-10-CM | POA: Diagnosis not present

## 2024-02-24 DIAGNOSIS — Z8546 Personal history of malignant neoplasm of prostate: Secondary | ICD-10-CM | POA: Diagnosis not present

## 2024-02-24 DIAGNOSIS — E782 Mixed hyperlipidemia: Secondary | ICD-10-CM | POA: Diagnosis not present

## 2024-02-24 DIAGNOSIS — I10 Essential (primary) hypertension: Secondary | ICD-10-CM | POA: Diagnosis not present

## 2024-03-02 DIAGNOSIS — I10 Essential (primary) hypertension: Secondary | ICD-10-CM | POA: Diagnosis not present

## 2024-03-03 DIAGNOSIS — H401112 Primary open-angle glaucoma, right eye, moderate stage: Secondary | ICD-10-CM | POA: Diagnosis not present

## 2024-03-03 DIAGNOSIS — H401123 Primary open-angle glaucoma, left eye, severe stage: Secondary | ICD-10-CM | POA: Diagnosis not present

## 2024-03-18 DIAGNOSIS — Z79899 Other long term (current) drug therapy: Secondary | ICD-10-CM | POA: Diagnosis not present

## 2024-03-18 DIAGNOSIS — H401113 Primary open-angle glaucoma, right eye, severe stage: Secondary | ICD-10-CM | POA: Diagnosis not present

## 2024-03-18 DIAGNOSIS — I10 Essential (primary) hypertension: Secondary | ICD-10-CM | POA: Diagnosis not present

## 2024-03-18 DIAGNOSIS — Z86711 Personal history of pulmonary embolism: Secondary | ICD-10-CM | POA: Diagnosis not present

## 2024-03-18 DIAGNOSIS — E782 Mixed hyperlipidemia: Secondary | ICD-10-CM | POA: Diagnosis not present

## 2024-03-25 DIAGNOSIS — Z8546 Personal history of malignant neoplasm of prostate: Secondary | ICD-10-CM | POA: Diagnosis not present

## 2024-03-25 DIAGNOSIS — I10 Essential (primary) hypertension: Secondary | ICD-10-CM | POA: Diagnosis not present

## 2024-03-25 DIAGNOSIS — Z23 Encounter for immunization: Secondary | ICD-10-CM | POA: Diagnosis not present

## 2024-03-25 DIAGNOSIS — E782 Mixed hyperlipidemia: Secondary | ICD-10-CM | POA: Diagnosis not present

## 2024-03-25 DIAGNOSIS — Z79899 Other long term (current) drug therapy: Secondary | ICD-10-CM | POA: Diagnosis not present

## 2024-03-25 DIAGNOSIS — Z0000 Encounter for general adult medical examination without abnormal findings: Secondary | ICD-10-CM | POA: Diagnosis not present

## 2024-03-25 DIAGNOSIS — H409 Unspecified glaucoma: Secondary | ICD-10-CM | POA: Diagnosis not present

## 2024-03-25 DIAGNOSIS — E559 Vitamin D deficiency, unspecified: Secondary | ICD-10-CM | POA: Diagnosis not present

## 2024-04-03 DIAGNOSIS — I10 Essential (primary) hypertension: Secondary | ICD-10-CM | POA: Diagnosis not present

## 2024-04-25 DIAGNOSIS — Z8546 Personal history of malignant neoplasm of prostate: Secondary | ICD-10-CM | POA: Diagnosis not present

## 2024-04-25 DIAGNOSIS — E782 Mixed hyperlipidemia: Secondary | ICD-10-CM | POA: Diagnosis not present

## 2024-04-25 DIAGNOSIS — I10 Essential (primary) hypertension: Secondary | ICD-10-CM | POA: Diagnosis not present

## 2024-05-03 DIAGNOSIS — I10 Essential (primary) hypertension: Secondary | ICD-10-CM | POA: Diagnosis not present

## 2024-05-25 DIAGNOSIS — I10 Essential (primary) hypertension: Secondary | ICD-10-CM | POA: Diagnosis not present

## 2024-05-25 DIAGNOSIS — Z8546 Personal history of malignant neoplasm of prostate: Secondary | ICD-10-CM | POA: Diagnosis not present

## 2024-05-25 DIAGNOSIS — E782 Mixed hyperlipidemia: Secondary | ICD-10-CM | POA: Diagnosis not present

## 2024-06-02 DIAGNOSIS — I10 Essential (primary) hypertension: Secondary | ICD-10-CM | POA: Diagnosis not present

## 2024-06-10 DIAGNOSIS — H43813 Vitreous degeneration, bilateral: Secondary | ICD-10-CM | POA: Diagnosis not present

## 2024-06-10 DIAGNOSIS — Z961 Presence of intraocular lens: Secondary | ICD-10-CM | POA: Diagnosis not present

## 2024-06-10 DIAGNOSIS — H35373 Puckering of macula, bilateral: Secondary | ICD-10-CM | POA: Diagnosis not present

## 2024-06-10 DIAGNOSIS — H401123 Primary open-angle glaucoma, left eye, severe stage: Secondary | ICD-10-CM | POA: Diagnosis not present

## 2024-06-10 DIAGNOSIS — H401112 Primary open-angle glaucoma, right eye, moderate stage: Secondary | ICD-10-CM | POA: Diagnosis not present

## 2024-06-25 DIAGNOSIS — E782 Mixed hyperlipidemia: Secondary | ICD-10-CM | POA: Diagnosis not present

## 2024-06-25 DIAGNOSIS — Z8546 Personal history of malignant neoplasm of prostate: Secondary | ICD-10-CM | POA: Diagnosis not present

## 2024-06-25 DIAGNOSIS — I10 Essential (primary) hypertension: Secondary | ICD-10-CM | POA: Diagnosis not present

## 2024-07-02 DIAGNOSIS — I10 Essential (primary) hypertension: Secondary | ICD-10-CM | POA: Diagnosis not present

## 2024-07-03 DIAGNOSIS — H401123 Primary open-angle glaucoma, left eye, severe stage: Secondary | ICD-10-CM | POA: Diagnosis not present

## 2024-07-03 DIAGNOSIS — H401112 Primary open-angle glaucoma, right eye, moderate stage: Secondary | ICD-10-CM | POA: Diagnosis not present

## 2024-07-26 DIAGNOSIS — I10 Essential (primary) hypertension: Secondary | ICD-10-CM | POA: Diagnosis not present

## 2024-07-26 DIAGNOSIS — E782 Mixed hyperlipidemia: Secondary | ICD-10-CM | POA: Diagnosis not present

## 2024-07-26 DIAGNOSIS — Z8546 Personal history of malignant neoplasm of prostate: Secondary | ICD-10-CM | POA: Diagnosis not present

## 2024-08-01 DIAGNOSIS — I10 Essential (primary) hypertension: Secondary | ICD-10-CM | POA: Diagnosis not present

## 2024-08-14 DIAGNOSIS — Z23 Encounter for immunization: Secondary | ICD-10-CM | POA: Diagnosis not present

## 2024-08-25 DIAGNOSIS — Z8546 Personal history of malignant neoplasm of prostate: Secondary | ICD-10-CM | POA: Diagnosis not present

## 2024-08-25 DIAGNOSIS — I10 Essential (primary) hypertension: Secondary | ICD-10-CM | POA: Diagnosis not present

## 2024-08-25 DIAGNOSIS — E782 Mixed hyperlipidemia: Secondary | ICD-10-CM | POA: Diagnosis not present

## 2024-08-31 DIAGNOSIS — I10 Essential (primary) hypertension: Secondary | ICD-10-CM | POA: Diagnosis not present

## 2024-09-25 DIAGNOSIS — I10 Essential (primary) hypertension: Secondary | ICD-10-CM | POA: Diagnosis not present

## 2024-09-25 DIAGNOSIS — Z8546 Personal history of malignant neoplasm of prostate: Secondary | ICD-10-CM | POA: Diagnosis not present

## 2024-09-25 DIAGNOSIS — E782 Mixed hyperlipidemia: Secondary | ICD-10-CM | POA: Diagnosis not present

## 2024-10-09 DIAGNOSIS — Z961 Presence of intraocular lens: Secondary | ICD-10-CM | POA: Diagnosis not present

## 2024-10-09 DIAGNOSIS — H401123 Primary open-angle glaucoma, left eye, severe stage: Secondary | ICD-10-CM | POA: Diagnosis not present

## 2024-10-09 DIAGNOSIS — H43813 Vitreous degeneration, bilateral: Secondary | ICD-10-CM | POA: Diagnosis not present

## 2024-10-09 DIAGNOSIS — H401112 Primary open-angle glaucoma, right eye, moderate stage: Secondary | ICD-10-CM | POA: Diagnosis not present

## 2024-10-09 DIAGNOSIS — H35373 Puckering of macula, bilateral: Secondary | ICD-10-CM | POA: Diagnosis not present

## 2024-10-12 DIAGNOSIS — I10 Essential (primary) hypertension: Secondary | ICD-10-CM | POA: Diagnosis not present

## 2024-10-25 DIAGNOSIS — E782 Mixed hyperlipidemia: Secondary | ICD-10-CM | POA: Diagnosis not present

## 2024-10-25 DIAGNOSIS — Z8546 Personal history of malignant neoplasm of prostate: Secondary | ICD-10-CM | POA: Diagnosis not present

## 2024-10-25 DIAGNOSIS — I10 Essential (primary) hypertension: Secondary | ICD-10-CM | POA: Diagnosis not present
